# Patient Record
Sex: Male | Born: 1970 | Race: Black or African American | Hispanic: No | Marital: Single | Smoking: Never smoker
Health system: Southern US, Community
[De-identification: ages and names within clinical notes are randomized; demographics above are authoritative.]

## PROBLEM LIST (undated history)

## (undated) DIAGNOSIS — M25561 Pain in right knee: Secondary | ICD-10-CM

## (undated) DIAGNOSIS — M25562 Pain in left knee: Secondary | ICD-10-CM

## (undated) DIAGNOSIS — M67919 Unspecified disorder of synovium and tendon, unspecified shoulder: Secondary | ICD-10-CM

## (undated) DIAGNOSIS — I10 Essential (primary) hypertension: Secondary | ICD-10-CM

## (undated) DIAGNOSIS — K5792 Diverticulitis of intestine, part unspecified, without perforation or abscess without bleeding: Secondary | ICD-10-CM

## (undated) DIAGNOSIS — E782 Mixed hyperlipidemia: Secondary | ICD-10-CM

## (undated) DIAGNOSIS — G8929 Other chronic pain: Secondary | ICD-10-CM

## (undated) DIAGNOSIS — M719 Bursopathy, unspecified: Secondary | ICD-10-CM

## (undated) DIAGNOSIS — R740 Nonspecific elevation of levels of transaminase and lactic acid dehydrogenase [LDH]: Secondary | ICD-10-CM

## (undated) DIAGNOSIS — E669 Obesity, unspecified: Secondary | ICD-10-CM

## (undated) HISTORY — DX: Pain in left knee: M25.562

## (undated) HISTORY — DX: Pain in right knee: M25.561

## (undated) HISTORY — DX: Essential (primary) hypertension: I10

## (undated) HISTORY — DX: Diverticulitis of intestine, part unspecified, without perforation or abscess without bleeding: K57.92

## (undated) HISTORY — DX: Unspecified disorder of synovium and tendon, unspecified shoulder: M67.919

## (undated) HISTORY — DX: Bursopathy, unspecified: M71.9

## (undated) HISTORY — DX: Mixed hyperlipidemia: E78.2

## (undated) HISTORY — DX: Other chronic pain: G89.29

## (undated) HISTORY — DX: Obesity, unspecified: E66.9

## (undated) HISTORY — DX: Nonspecific elevation of levels of transaminase and lactic acid dehydrogenase (ldh): R74.0

---

## 2001-06-22 ENCOUNTER — Emergency Department (HOSPITAL_COMMUNITY): Admission: EM | Admit: 2001-06-22 | Discharge: 2001-06-22 | Payer: Self-pay

## 2007-05-13 ENCOUNTER — Ambulatory Visit: Payer: Self-pay | Admitting: Family Medicine

## 2007-05-13 DIAGNOSIS — E785 Hyperlipidemia, unspecified: Secondary | ICD-10-CM | POA: Insufficient documentation

## 2007-05-13 DIAGNOSIS — E669 Obesity, unspecified: Secondary | ICD-10-CM

## 2007-05-13 DIAGNOSIS — E782 Mixed hyperlipidemia: Secondary | ICD-10-CM

## 2007-05-13 HISTORY — DX: Obesity, unspecified: E66.9

## 2007-05-13 HISTORY — DX: Mixed hyperlipidemia: E78.2

## 2007-05-13 LAB — CONVERTED CEMR LAB
BUN: 14 mg/dL (ref 6–23)
CO2: 26 meq/L (ref 19–32)
Calcium: 9.6 mg/dL (ref 8.4–10.5)
Chloride: 103 meq/L (ref 96–112)
Creatinine, Ser: 0.96 mg/dL (ref 0.40–1.50)
LDL Cholesterol: 197 mg/dL — ABNORMAL HIGH (ref 0–99)

## 2007-05-14 ENCOUNTER — Encounter: Payer: Self-pay | Admitting: Family Medicine

## 2008-05-26 ENCOUNTER — Telehealth: Payer: Self-pay | Admitting: Family Medicine

## 2008-06-22 ENCOUNTER — Ambulatory Visit: Payer: Self-pay | Admitting: Family Medicine

## 2008-06-22 ENCOUNTER — Ambulatory Visit (HOSPITAL_COMMUNITY): Admission: RE | Admit: 2008-06-22 | Discharge: 2008-06-22 | Payer: Self-pay | Admitting: Family Medicine

## 2008-06-22 DIAGNOSIS — I10 Essential (primary) hypertension: Secondary | ICD-10-CM | POA: Insufficient documentation

## 2008-06-22 LAB — CONVERTED CEMR LAB
CO2: 25 meq/L (ref 19–32)
Chloride: 106 meq/L (ref 96–112)
Glucose, Bld: 83 mg/dL (ref 70–99)
Potassium: 4.1 meq/L (ref 3.5–5.3)
Sodium: 141 meq/L (ref 135–145)

## 2008-06-23 ENCOUNTER — Encounter: Payer: Self-pay | Admitting: Family Medicine

## 2008-06-26 ENCOUNTER — Encounter: Payer: Self-pay | Admitting: Family Medicine

## 2008-07-11 ENCOUNTER — Ambulatory Visit: Payer: Self-pay | Admitting: Family Medicine

## 2009-03-07 ENCOUNTER — Ambulatory Visit: Payer: Self-pay | Admitting: Family Medicine

## 2009-04-30 ENCOUNTER — Ambulatory Visit: Payer: Self-pay | Admitting: Family Medicine

## 2009-04-30 LAB — CONVERTED CEMR LAB
HDL: 52 mg/dL (ref >39)
LDL Cholesterol: 161 mg/dL — ABNORMAL HIGH (ref 0–99)
Total CHOL/HDL Ratio: 4.3
Triglycerides: 53 mg/dL (ref <150)
VLDL: 11 mg/dL (ref 0–40)

## 2009-05-01 ENCOUNTER — Encounter: Payer: Self-pay | Admitting: Family Medicine

## 2009-05-24 ENCOUNTER — Ambulatory Visit: Payer: Self-pay | Admitting: Family Medicine

## 2009-05-24 DIAGNOSIS — M67919 Unspecified disorder of synovium and tendon, unspecified shoulder: Secondary | ICD-10-CM | POA: Insufficient documentation

## 2009-05-24 DIAGNOSIS — M719 Bursopathy, unspecified: Secondary | ICD-10-CM

## 2009-05-24 HISTORY — DX: Unspecified disorder of synovium and tendon, unspecified shoulder: M67.919

## 2009-05-30 ENCOUNTER — Encounter: Admission: RE | Admit: 2009-05-30 | Discharge: 2009-08-02 | Payer: Self-pay | Admitting: Family Medicine

## 2009-07-03 ENCOUNTER — Encounter: Payer: Self-pay | Admitting: Family Medicine

## 2009-08-07 ENCOUNTER — Telehealth: Payer: Self-pay | Admitting: Family Medicine

## 2009-08-09 ENCOUNTER — Encounter: Payer: Self-pay | Admitting: Family Medicine

## 2010-03-25 ENCOUNTER — Telehealth: Payer: Self-pay | Admitting: Family Medicine

## 2010-03-25 ENCOUNTER — Ambulatory Visit: Payer: Self-pay | Admitting: Family Medicine

## 2010-04-01 ENCOUNTER — Ambulatory Visit: Payer: Self-pay

## 2010-04-22 ENCOUNTER — Encounter: Payer: Self-pay | Admitting: Family Medicine

## 2010-04-22 LAB — CONVERTED CEMR LAB
CO2: 25 meq/L (ref 19–32)
Calcium: 9.8 mg/dL (ref 8.4–10.5)
Cholesterol: 233 mg/dL — ABNORMAL HIGH (ref 0–200)
Creatinine, Ser: 1 mg/dL (ref 0.40–1.50)
HDL: 53 mg/dL (ref >39)
PSA: 1.06 ng/mL (ref <=4.00)
Triglycerides: 67 mg/dL (ref <150)

## 2010-04-23 ENCOUNTER — Encounter: Payer: Self-pay | Admitting: Family Medicine

## 2010-05-14 NOTE — Miscellaneous (Signed)
Summary: Consent Steroid Inj  Consent Steroid Inj   Imported By: Alexander Gentry 04/30/2009 16:23:06  _____________________________________________________________________  External Attachment:    Type:   Image     Comment:   External Document

## 2010-05-14 NOTE — Letter (Signed)
Summary: [facility] Lipid Letter  [name]  [address]   [place], [place] [address]   Phone: [phone]  Fax: [phone]    05/01/2009 MRN: [id number]  Alexander Gentry [address] [place], [place]  [address]  Dear Alexander Gentry:  We have carefully reviewed your last lipid profile from 04/30/2009 and the results are noted below with a summary of recommendations for lipid management.    Cholesterol:       224     Goal: <200   HDL "good" Cholesterol:   52     Goal: >40   LDL "bad" Cholesterol:   161     Goal: <160   Triglycerides:       53     Goal: <150   Your lipid goals have not been met; we recommend improving on your TLC diet and Adjunctive Measures (see below) and make the following changes to your regimen:    TLC Diet (Therapeutic Lifestyle Change): Saturated Fats & Transfatty acids should be kept < 7% of total calories ***Reduce Saturated Fats Polyunstaurated Fat can be up to 10% of total calories Monounsaturated Fat Fat can be up to 20% of total calories Total Fat should be no greater than 25-35% of total calories Carbohydrates should be 50-60% of total calories Protein should be approximately 15% of total calories Fiber should be at least 20-30 grams a day ***Increased fiber may help lower LDL Total Cholesterol should be < 200mg /day Consider adding plant stanol/sterols to diet (example: Benacol spread) ***A higher intake of unsaturated fat may reduce Triglycerides and Increase HDL    Adjunctive Measures (may lower LIPIDS and reduce risk of Heart Attack) include: Aerobic Exercise (20-30 minutes 3-4 times a week) Limit Alcohol Consumption Weight Reduction Aspirin 75-81 mg a day by mouth (if not allergic or contraindicated) Folic Acid 1mg  a day by mouth Dietary Fiber 20-30 grams a day by mouth     Current Medications:  None If you have any questions, please call. We appreciate being able to work with you.   Sincerely,   Tawanna Cooler Forest Pruden MD [name] Tawanna Cooler Inri Sobieski MD     Appended Document: [facility] Lipid Letter mailed.  Appended Document: [facility] Lipid Letter letter returned -

## 2010-05-14 NOTE — Assessment & Plan Note (Signed)
Summary: f/u,df   Vital Signs:  Patient profile:   40 year old male Height:      68 inches Weight:      206.6 pounds BMI:     31.53 Pulse rate:   68 / minute BP supine:   129 / 87  CC:  cholesterol and blood pressure.  History of Present Illness: Disease Surveillance   Blood pressure range: 120-130/ 70 -90   Chest pain:none   Dyspnea:none   Claudication:none  Medications   Compliance:On no medications  Prevention   Exercise:lifting weights    Diet pattern:not watching fats and salts in his diet.   Right shoulder pain Onset: about 1 year ago, progressive worsening over last month. No hx of prior shoulder pain Precipitant: No trauma.  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   Not lifting heavy loads or working with arms ovedrhead. Qaulity: aching, worse upon awakening in morning, worse when abducting ashoulder greater than 90 degrees. Radiation: radiates into anterolateral arm.  No radiation below elbow. No neck pain or stiffness. Exercise: does light weight liefting Txs: Tried Ibuprofen 800mg  without relief           Habits & Providers  Alcohol-Tobacco-Diet     Tobacco Status: never  Current Medications (verified): 1)  None  Allergies (verified): No Known Drug Allergies PMH-FH-SH reviewed for relevance  Family History: Reviewed history from 05/13/2007 and no changes required. 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 Never Smoked Drug use-no Alcohol use-no  Review of Systems MS:  Denies joint redness, joint swelling, and low back pain.  Physical Exam  General:  Well-developed,well-nourished,in no acute distress; alert,appropriate and cooperative throughout examination  overweight-appearing.   Neck:  supple, full ROM, and no masses. No  thyromegaly  Lungs:  normal respiratory effort, no intercostal retractions, no accessory muscle use, and normal breath sounds.   Heart:  normal rate, regular rhythm, no murmur, no gallop, no JVD, and no HJR.   Abdomen:  soft, non-tender, normal bowel sounds, no distention, no masses, no hepatomegaly, and no splenomegaly.     Shoulder/Elbow Exam  Shoulder Exam:    Right:    Inspection:  Normal    Palpation:  Normal    Stability:  stable    Tenderness:  no    Swelling:  no    Erythema:  no    Limited active internal ROM - able to get right thumb to mid to upper lumbar vert area Limited active external ROM _ able to reach left finger tips lowet on thoracic back than with right by  ~ 2-3 inches.  (+) right Impingement sign. Active abduction pain starts at 90 degrees   Impression & Recommendations:  Problem # 1:  BURSITIS, RIGHT SHOULDER (ICD-726.10) Assessment New  After discussing options of treatment  which included NSIADS, exercise, relative rest, corticosteroid injection. Mr Dolberry chose a trial corticosteroid injection.  Pt informed of risks of bleeding and infection with Intraarticular shoulder steroid injection.  Procedure: steril prep with betadine and alcohol swabbing of left lateral shoulder. Intraarticular injection with 1 ml kenalog (40mg /45ml) and 4 ml 2% Xylocaine of left shoulder from posterolateral approach. No complications. Patient had significant improvement in shoulder pain with AROM. Pt given ROM and strengthening exercises to start tomorrow for shoulder bursitis. RTC in two weeks to assess response.  If no improvement, consider Xrays and referral to Sports Medicine Clinic  Orders: [facility]- Est Level  3 ([address])  Problem # 2:  ELEVATED BLOOD PRESSURE WITHOUT DIAGNOSIS OF HYPERTENSION (ICD-796.2) Assessment: Comment Only  No evidence form office BP measures and reported home BP measures, nor historical evidence of endorgan damage to support diagnosis of hypertension  at this time.  Patient in high normal BP range that warrants continued annual BP monitoring in office and continue home monitoring as needed.  Orders: [facility]- Est Level  3 ([address])  Problem # 3:  HYPERLIPIDEMIA (ICD-272.2) Goal LDL < 160 mg/dL. Encourgaed weight loss, exercise, and low fat, low cholesterol diet. Lipid panel pending.  Orders: Lipid-[facility] ([id number]) [facility]- Est Level  3 ([address])  Patient Instructions: 1)  Please schedule a follow-up appointment in 2 to 3 weeks. May double book.  This is to recheck shoulder bursitis. 2)  Tomorrow, start the shoulder exercises.  Do five repetions of each shoulder exercise 3 times a day.  3)  I will let you know the results of your blood work.    Prevention & Chronic Care Immunizations   Influenza vaccine: Fluvax Non-MCR  (03/07/2009)    Tetanus booster: 06/22/2008: Tdap    Pneumococcal vaccine: Not documented  Other Screening   Smoking status: never  (04/30/2009)  Lipids   Total Cholesterol: 264  (05/13/2007)   LDL: 197  (05/13/2007)   LDL Direct: 175  (06/22/2008)   HDL: 48  (05/13/2007)   Triglycerides: 97  (05/13/2007)    SGOT (AST): Not documented   SGPT (ALT): Not documented   Alkaline phosphatase: Not documented   Total bilirubin: Not documented    Lipid flowsheet reviewed?: Yes   Progress toward LDL goal: Unchanged  Self-Management Support :   Personal Goals (by the next clinic visit) :      Personal LDL goal: 160  (04/30/2009)    Lipid self-management support: Written self-care plan, Education handout, Pre-printed educational material  (04/30/2009)   Lipid self-care plan printed.   Lipid education handout printed

## 2010-05-14 NOTE — Miscellaneous (Signed)
Summary: Discharge Summary for Physical Therapy  Alexander Gentry met his physical therapy goals for his right shoulder pain. He is independent in his home exercise program He reports decrease of 50% with all motions. Increase ROM to WNL throughout. Increase strength to 4/5 throughout shoulder and elbow on the right.  Remaining deficits: None except for ratre pain with increased activity. Patient states he is 90% better than prior to Physical Therapy interventions.  Patient agrees to discharge. Patient goals were met.

## 2010-05-14 NOTE — Progress Notes (Signed)
Summary: phn msg  Phone Note Call from Patient Call back at Home Phone [phone]   Caller: Patient Summary of Call: wants to know if he could bring his son(new pt) in for a school phys before he goes to college? DREVON PLOG [birth year]/17[birth year]90  Erlene Senters [phone] - grandmother Initial call taken by: De Nurse,  August 07, 2009 11:21 AM  Follow-up for Phone Call        Please call patient to arrange appointment for his son.  If patient cannot see me in a timely fashion, please schedule patient with a resident. Follow-up by: Tawanna Cooler Lyna Laningham MD,  August 07, 2009 11:25 AM  Additional Follow-up for Phone Call Additional follow up Details #1::        appt made for 5/5 w/ Timmya Blazier Additional Follow-up by: De Nurse,  August 07, 2009 2:34 PM

## 2010-05-14 NOTE — Miscellaneous (Signed)
Summary: Authorization for Iontophoresis Dexamethasone   Clinical Lists Changes Signed and fax authorization for iontophoresis 4mg /mL of dexamethasone for  Alexander Gentry's right shoulder to [facility].

## 2010-05-14 NOTE — Assessment & Plan Note (Signed)
Summary: F/U VISIT Right Shoulder Pain/[facility]   Vital Signs:  Patient profile:   40 year old male Height:      68 inches Weight:      200.5 pounds BMI:     30.60 Temp:     98.2 degrees F oral Pulse rate:   73 / minute BP sitting:   132 / 85  (right arm) Cuff size:   regular  Vitals Entered By: Garen Grams LPN (May 24, 2009 9:07 AM) CC: f/u shoulder pain Is Patient Diabetic? No Pain Assessment Patient in pain? yes     Location: right shoulder   CC:  f/u shoulder pain.  History of Present Illness: Right shoulder pain Improved only for one day after corticosteroid shoulder injection on 04-30-09. 40 YO L. 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   Sharp right shoulder pain in lateral shoulder that goes down about a quarter way arm.  No pain below elbow.  Onset:  ~ 6 months ago   Trauma hx: No    No prior diagnostic workup   No progression in pain Worse with external rotation of arm or when carrying groceries or other heavy objects.. Shoulder feels tired.  Not better with Ibuprofen.  No swelling, No clicking, no locking, No neck pain   Habits & Providers  Alcohol-Tobacco-Diet     Alcohol drinks/day: <1     Tobacco Status: never  Current Problems (verified): 1)  Rotator Cuff Injury, Right Shoulder  (ICD-726.10) 2)  Bursitis, Right Shoulder  (ICD-726.10) 3)  Need Prophylactic Vaccination&inoculation Flu  (ICD-V04.81) 4)  Elevated Blood Pressure Without Diagnosis of Hypertension  (ICD-796.2) 5)  Obesity  (ICD-278.00) 6)  Hyperlipidemia  (ICD-272.2) 7)  Family History of Diabetes Mellitus  (ICD-V18.0)  Current Medications (verified): 1)  Naprosyn 500 Mg Tabs (Naproxen) .... One Tablet Twice A Day With Food If Needed For Shoulder Pain  Allergies (verified): No Known Drug Allergies  Past History:  Past medical, surgical, family and social histories (including risk factors) reviewed for relevance to current  acute and chronic problems.  Past Medical History: Reviewed history from 05/13/2007 and no changes required. None  Past Surgical History: Reviewed history from 05/13/2007 and no changes required. None  Family History: Reviewed history from 05/13/2007 and no changes required. Diabetes in maternal uncle Family History Hypertension in Mother  Social History: Reviewed history from 04/30/2009 and no changes required. 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 Never Smoked Drug use-no Alcohol use-occassional  Physical Exam  General:  Well-developed,well-nourished,in no acute distress; alert,appropriate and cooperative throughout examination Neck:  Full ROM Negative Spurling's Maneuver Nontender Trapezius/supraspinatus/infrospinatus. Msk:  Right Shoulder Symmetric contour shoulders No erythema or edema Active ABduction full (+) Apley Scretch test (+) Reverse Apley scratch test (+) Empty Can test Weaker R c.f. L resisted external rotation  Weaker R c.f. L "Lift-off" test of internal rotation (+) Neer's (+) Hawkins (-) Drop-Arm test (-) Cross-arm test Pulses:  R radial normal.     Impression & Recommendations:  Problem # 1:  ROTATOR CUFF INJURY, RIGHT SHOULDER (ICD-726.10) Failed corticosteroid intraarticular injection of shoulder.  Plan: Naproxen 500 mg tablets, twice a day as needed. Take with food Internal and External arm rotation exercises Physical Therapy referral for evaluation and treatment Pt education material about rotator cuff tendonitis RTC in 6-7 weeks for re-evaluation.  Right Shoulder Xray and  Referral to Sports Medicine Clinic if  not improved at that time Orders: Physical Therapy Referral (PT) [facility]- Est Level  3 ([address]25)  Complete Medication List: 1)  Naprosyn 500 Mg Tabs (Naproxen) .... One tablet twice a day with food if needed for shoulder pain  Patient Instructions: 1)   Please schedule a follow-up appointment in 6-7 weeks to see how your shoulder is doing. May double book. 2)  Do the internal and external rotation exercises for your shoulder. Do ten repetitions three times a day. 3)  [name] will contact you about starting physical therapy for your shoulder.  4)    Prescriptions: NAPROSYN 500 MG TABS (NAPROXEN) One tablet twice a day with food if needed for shoulder pain  #60 x 1   Entered and Authorized by:   Tawanna Cooler Deaunte Dente MD   Signed by:   Tawanna Cooler Jovian Lembcke MD on 05/24/2009   Method used:   Print then Give to Patient   RxID:   [id number]

## 2010-05-16 NOTE — Assessment & Plan Note (Signed)
Summary: meds check,df   Vital Signs:  Patient profile:   40 year old male Height:      68 inches Weight:      209 pounds BMI:     31.89 BSA:     2.08 Temp:     97.8 degrees F Pulse rate:   74 / minute BP sitting:   141 / 94  Vitals Entered By: Jone Baseman CMA (April 22, 2010 2:30 PM)  Serial Vital Signs/Assessments:  Time      Position  BP       Pulse  Resp  Temp     By                     142/92                         Tawanna Cooler Renette Hsu MD  CC: f/u Is Patient Diabetic? No Pain Assessment Patient in pain? no        CC:  f/u.  History of Present Illness: Disease Surveillance in patient with history of elevated blood pressure   Blood pressure range:Not checking at home.   Chest pain:no   Dyspnea:no   Claudication:no  Medications   Compliance:not taking any antihypertensives    Lightheadedness:no   Urinary frequency:no   Edema:no   Impotence:no  Prevention   Exercise:Doing "cardio" most days out of the week at gym   Diet pattern:not watching fats.  Is using salt substitute.        Habits & Providers  Alcohol-Tobacco-Diet     Alcohol drinks/day: <1     Tobacco Status: never  Current Problems (verified): 1)  Elevated Blood Pressure Without Diagnosis of Hypertension  (ICD-796.2) 2)  Obesity  (ICD-278.00) 3)  Hyperlipidemia  (ICD-272.2) 4)  Family History of Diabetes Mellitus  (ICD-V18.0) 5)  Rotator Cuff Injury, Right Shoulder  (ICD-726.10)  Current Medications (verified): 1)  Naprosyn 500 Mg Tabs (Naproxen) .... One Tablet Twice A Day With Food If Needed For Shoulder Pain  Allergies (verified): No Known Drug Allergies  Family History: Diabetes in maternal uncle Family History Hypertension in Mother No history of cancer.   Physical Exam  General:  alert and well-developed. alert, well-developed, healthy-appearing, cooperative to examination, good hygiene, and overweight-appearing.   Eyes:  pupils equal, pupils round, pupils reactive to  light, corneas and lenses clear, no injection, no optic disk abnormalities, no retinal abnormalitiies, and normal cup-disc ratio.   Neck:  no thyromegaly and no carotid bruits.   Chest Wall:  no deformities.   Breasts:  no gynecomastia.   Lungs:  normal respiratory effort, normal breath sounds, no crackles, and no wheezes.   Heart:  normal rate, regular rhythm, no murmur, no gallop, and no JVD.   Abdomen:  soft, non-tender, normal bowel sounds, no distention, no masses, no abdominal hernia, no hepatomegaly, and no splenomegaly.   Pulses:  R dorsalis pedis normal and L dorsalis pedis normal.   Extremities:  No peripheral edema. No clubbing.  Psych:  normally interactive, good eye contact, not anxious appearing, and not depressed appearing.     Impression & Recommendations:  Problem # 1:  ELEVATED BLOOD PRESSURE WITHOUT DIAGNOSIS OF HYPERTENSION (ICD-796.2) Assessment Deteriorated Raygen's elevated blood pressure in the past has responded to lifestyle changes and weight loss.  He is agreeable to increasing his exercise and improving his diet for next 3 months to see if he can get his BP under control.  RTC in 3 months for assessment.   Problem # 2:  OBESITY (ICD-278.00) Assessment: Deteriorated Weight is up from last visit.  See px #1 for recommendations.  Orders: Basic Met-[facility] ([id number]) [facility]- Est Level  3 ([address])  Problem # 3:  HYPERLIPIDEMIA (ICD-272.2) Essentially unchanged from previous at 167 mg/dL.  enourgae TLC and adjuvant meausres.  Pt education material on role of diet and exercise in controlling LDL cholesterol.  Will monitor annually. Would not recommend pursuing statin therapy at this time.  Orders: Lipid-[facility] ([id number]) [facility]- Est Level  3 (87564)  Problem # 4:  Preventive Health Care (ICD-V70.0) screening PSA within acceptable range.  Recheck in 2 years for screening for prostate cancer in higher risk population (AA male)  Complete Medication List: 1)  Naprosyn 500  Mg Tabs (Naproxen) .... One tablet twice a day with food if needed for shoulder pain  Other Orders: PSA-[facility] ([id number])  Patient Instructions: 1)  Please schedule a follow-up appointment in 3 months to follow up high blood pressure.Marland Kitchen  2)  Keep up your cardio workouts, they will help you lose weight. 3)  You should aim for exercising most days a week for at least 30 minutes.  4)  Emphasize fresh vegetables and fruits in your meals and snacks.    Orders Added: 1)  Basic Met-[facility] [[id number]] 2)  Lipid-[facility] [[id number]] 3)  PSA-[facility] [[id number]] 4)  [facility]- Est Level  3 [20254]        Prevention & Chronic Care Immunizations   Influenza vaccine: Fluvax Non-MCR  (03/07/2009)   Influenza vaccine due: 03/07/2010    Tetanus booster: 06/22/2008: Tdap   Tetanus booster due: 06/23/2018    Pneumococcal vaccine: Not documented  Other Screening   Smoking status: never  (04/22/2010)  Lipids   Total Cholesterol: 224  (04/30/2009)   LDL: 161  (04/30/2009)   LDL Direct: 175  (06/22/2008)   HDL: 52  (04/30/2009)   Triglycerides: 53  (04/30/2009)    SGOT (AST): Not documented   SGPT (ALT): Not documented   Alkaline phosphatase: Not documented   Total bilirubin: Not documented    Lipid flowsheet reviewed?: Yes   Progress toward LDL goal: Unchanged  Self-Management Support :   Personal Goals (by the next clinic visit) :      Personal LDL goal: 160  (04/30/2009)    Lipid self-management support: Written self-care plan, Education handout, Pre-printed educational material  (04/22/2010)   Lipid self-care plan printed.   Lipid education handout printed  Appended Document: Orders Update    Clinical Lists Changes  Observations: Added new observation of FLU VAX: Fluvax 3+ (04/22/2010 8:43)       Immunization History:  Influenza Immunization History:    Influenza:  fluvax 3+ (04/22/2010)

## 2010-05-16 NOTE — Letter (Signed)
Summary: [facility] Lipid Letter  [facility]  [address]   [place], [place] [address]   Phone: [phone]  Fax: [phone]    04/23/2010 MRN: [id number]  Alexander Gentry [address] [place], [place]  [address]  Dear Alexander Gentry:  Your kidney, blood sugar, and electrolyte blood tests were all normal. Your blood sugar was normal. The test for prostate cancer was normal.  Would repeat this test in two years.   Here is your last cholesterol profile from 04/22/2010 and the results are noted below with a summary of recommendations for lipid management.    Cholesterol:       233     Goal: <200   HDL "good" Cholesterol:   53     Goal: >40   LDL "bad" Cholesterol:   167               Goal: <160   Triglycerides:       67     Goal: <150   Your LDL "bad" cholesterol goal has not been met; I recommend improving on your TLC diet and Adjunctive Measures (see below) and make the following changes to your regimen:    TLC Diet (Therapeutic Lifestyle Change): Saturated Fats & Transfatty acids should be kept < 7% of total calories ***Reduce Saturated Fats Polyunstaurated Fat can be up to 10% of total calories Monounsaturated Fat Fat can be up to 20% of total calories Total Fat should be no greater than 25-35% of total calories Carbohydrates should be 50-60% of total calories Protein should be approximately 15% of total calories Fiber should be at least 20-30 grams a day ***Increased fiber may help lower LDL Total Cholesterol should be < 200mg /day Consider adding plant stanol/sterols to diet (example: Benacol spread) ***A higher intake of unsaturated fat may reduce Triglycerides and Increase HDL    Adjunctive Measures (may lower LIPIDS and reduce risk of Heart Attack) include: Aerobic Exercise (20-30 minutes 3-4 times a week) Limit Alcohol Consumption Weight Reduction Aspirin 75-81 mg a day by mouth (if not allergic or contraindicated) Folic Acid 1mg  a day by  mouth Dietary Fiber 20-30 grams a day by mouth  You can read more about how to lower your cholesterol with changes in diet at [employer].  Search this government website for "hypercholesterolemia".      Current Medications: 1)    Naprosyn 500 Mg Tabs (Naproxen) .... One tablet twice a day with food if needed for shoulder pain  If you have any questions, please call. We appreciate being able to work with you.   Sincerely,   Tawanna Cooler Bow Buntyn MD [name] Tawanna Cooler Kelsen Celona MD     Appended Document: [facility] Lipid Letter sent

## 2010-05-16 NOTE — Progress Notes (Signed)
Summary: resch  Phone Note Call from Patient   Summary of Call: pt has the flu and didn't want to spead it around - resch for 1st of year Initial call taken by: De Nurse,  March 25, 2010 11:46 AM  Follow-up for Phone Call        Lynsi Dooner Notified Follow-up by: Tawanna Cooler McDiarmid MD,  March 25, 2010 11:57 AM

## 2010-05-20 ENCOUNTER — Encounter: Payer: Self-pay | Admitting: *Deleted

## 2010-11-13 DIAGNOSIS — I10 Essential (primary) hypertension: Secondary | ICD-10-CM

## 2010-11-13 HISTORY — DX: Essential (primary) hypertension: I10

## 2010-12-09 ENCOUNTER — Encounter: Payer: Self-pay | Admitting: Family Medicine

## 2010-12-09 ENCOUNTER — Ambulatory Visit (INDEPENDENT_AMBULATORY_CARE_PROVIDER_SITE_OTHER): Payer: 59 | Admitting: Family Medicine

## 2010-12-09 DIAGNOSIS — E7800 Pure hypercholesterolemia, unspecified: Secondary | ICD-10-CM

## 2010-12-09 DIAGNOSIS — E782 Mixed hyperlipidemia: Secondary | ICD-10-CM

## 2010-12-09 DIAGNOSIS — I10 Essential (primary) hypertension: Secondary | ICD-10-CM

## 2010-12-09 LAB — COMPREHENSIVE METABOLIC PANEL
ALT: 57 U/L — ABNORMAL HIGH (ref 0–53)
AST: 29 U/L (ref 0–37)
Creat: 0.93 mg/dL (ref 0.50–1.35)
Total Bilirubin: 0.5 mg/dL (ref 0.3–1.2)

## 2010-12-09 MED ORDER — HYDROCHLOROTHIAZIDE 25 MG PO TABS: 12.5000 mg | ORAL_TABLET | Freq: Every day | ORAL | Status: DC

## 2010-12-09 MED ORDER — NAPROXEN 500 MG PO TABS: 500.0000 mg | ORAL_TABLET | Freq: Two times a day (BID) | ORAL | Status: DC

## 2010-12-09 NOTE — Patient Instructions (Addendum)
Start Hydrochlorothiazide blood pressure medication for high blood pressure.  Take half a tablet once a day in the morning.  Avoid salty foods.  If it tastes salty it is salty.  Avoid canned and frozen foods unless they say low sodium or low salt.  Goal weight of 200 pounds would be very healthy for you and may help get you off of Blood pressure medicines. Dr McDiarmid will contact you about your cholesterol if you should consider starting a cholesterol lowering medication like Lipitor or Crestor.  3 to 4 Gram Sodium Diet, No Added Salt (NAS) A 3 to 4 gram sodium diet restricts the amount of sodium in the diet to no more than 3 to 4 grams (g) or 3000 to 4000 milligrams (mg) daily. Limiting the amount of sodium is often used to help lower blood pressure. It is important if you have heart, liver, or kidney problems. Many foods contain sodium for flavor and sometimes as a preservative. When the amount of sodium in a diet needs to be low, it is important to know what to look for when choosing foods and drinks. The following includes some information and guidelines to help make it easier for you to adapt to a low sodium diet. QUICK TIPS  Do not add salt to food.   Avoid convenience items and fast food.   Choose unsalted snack foods.   Buy lower sodium products, often labeled as "lower sodium" or "no salt added."   Check food labels to learn how much sodium is in 1 serving.   When eating at a restaurant, ask that your food be prepared with less salt or none, if possible.  READING FOOD LABELS FOR SODIUM INFORMATION The nutrition facts label is a good place to find how much sodium is in foods. Look for products with no more than 500 to 600 mg of sodium per meal and no more than 150 mg per serving. Remember that 3 to 4 g = 3000 to 4000 mg. The food label may also list foods as:  Sodium-free: Less than 5 mg in a serving.   Very low sodium: 35 mg or less in a serving.   Low-sodium: 140 mg or less  in a serving.   Light in sodium: 50% less sodium in a serving. For example, if a food that usually has 300 mg of sodium is changed to become light in sodium, it will have 150 mg of sodium.   Reduced sodium: 25% less sodium in a serving. For example, if a food that usually has 400 mg of sodium is changed to reduced sodium, it will have 300 mg of sodium.  CHOOSING FOODS AVOID  CHOOSE   Grains:  Salted crackers and snack items. Bread stuffing and biscuit mixes. Seasoned rice or pasta mixes.  Grains:  Unsalted snack items. English muffins, breads, and rolls. Homemade pancakes and waffles. Most cereals. Pasta.   Meats:  Salted, canned, smoked, spiced, pickled meats, including fish and poultry. Bacon, ham, sausage, cold cuts, hot dogs, anchovies.  Meats:  Low-sodium canned tuna and salmon. Fresh or frozen meat, poultry, and fish.   Dairy:  Processed cheese and spreads. Cottage cheese. Buttermilk and condensed milk. Regular cheese.  Dairy:  Milk. Low-sodium cottage cheese. Yogurt. Sour cream. Low-sodium cheese.   Fruits and Vegetables:  Regular canned vegetables. Regular canned tomato sauce and paste. Frozen vegetables in sauces. Olives. Rosita Fire. Relishes. Sauerkraut.  Fruits and Vegetables:  Low-sodium canned vegetables. Low-sodium tomato sauce and paste. Frozen or fresh  vegetables. Fresh and frozen fruit.   Condiments:  Canned and packaged gravies. Worcestershire sauce. Tartar sauce. Barbecue sauce. Soy sauce. Steak sauce. Ketchup. Onion, garlic, and table salt. Meat flavorings and tenderizers.  Condiments:  Fresh and dried herbs and spices. Low-sodium varieties of mustard and ketchup. Lemon juice. Tabasco sauce. Horseradish.   SAMPLE 3 TO 4 GRAM SODIUM MEAL PLAN   Meal  Foods  Sodium (mg)   Breakfast  1 cup low-fat milk  2 slices whole-wheat toast 1 tablespoon heart-healthy margarine 1 hard-boiled egg  [phone]   Lunch  1 cup raw carrots   cup  hummus 1 cup low-fat milk  cup red grapes 1 cup low-sodium chicken and rice soup 10 low-sodium saltine crackers  [phone]   Dinner  1 cup whole-wheat pasta  1 cup tomato sauce 3 ounces (oz) lean ground beef 1 small side salad (1 cup raw spinach leaves,  cup cucumber,  cup yellow bell pepper)   1 teaspoon ranch dressing  2  [phone]   Snack  1 slice cheddar cheese  1 medium apple  258  1   Nutrient Analysis Calories: 2005 Protein: 85 g Carbohydrate: 245 g Fat: 78 g Sodium: 3560 mg Information from [email or web address] Document Released: 03/31/2005 Document Re-Released: 09/18/2009 [facility] Patient Information 2011 [place], [place].Low-Fat, Low-Saturated-Fat, Low-Cholesterol Diets Food Selection Guide BREADS, CEREALS, PASTA, RICE, DRIED PEAS, AND BEANS These products are high in carbohydrates and most are low in fat. Therefore, they can be increased in the diet as substitutes for fatty foods. They too, however, contain calories and should not be eaten in excess. Cereals can be eaten for snacks as well as for breakfast.   Include foods that contain fiber (fruits, vegetables, whole grains, and legumes). Research shows that fiber may lower blood cholesterol levels, especially the water-soluble fiber found in fruits, vegetables, oat products, and legumes. FRUITS AND VEGETABLES It is good to eat fruits and vegetables. Besides being sources of fiber, both are rich in vitamins and some minerals. They help you get the daily allowances of these nutrients. Fruits and vegetables can be used for snacks and desserts. MEATS Limit lean meat, chicken, [place], and fish to no more than 6 ounces per day. Beef, Pork, and Lamb  Use lean cuts of beef, pork, and lamb. Lean cuts include:   Extra-lean ground beef.   Arm roast.   Sirloin tip.   Center-cut ham.   Round steak.   Loin chops.   Rump roast.   Tenderloin.  Trim all fat off the outside of meats before cooking.  It is not necessary to severely decrease the intake of red meat, but lean choices should be made. Lean meat is rich in protein and contains a highly absorbable form of iron. Premenopausal women, in particular, should avoid reducing lean red meat because this could increase the risk for low red blood cells (iron-deficiency anemia). Processed Meats Processed meats, such as bacon, bologna, salami, sausage, and hot dogs contain large quantities of fat, are not rich in valuable nutrients, and should not be eaten very often. Organ Meats The organ meats, such as liver, sweetbreads, kidneys, and brain are very rich in cholesterol. They should be limited. Chicken and [place] These are good sources of protein. The fat of poultry can be reduced by removing the skin and underlying fat layers before cooking. Chicken and [place] can be substituted for lean red meat in the diet. Poultry should not  be fried or covered with high-fat sauces. Fish and Shellfish Fish is a good source of protein. Shellfish contain cholesterol, but they usually are low in saturated fatty acids. The preparation of fish is important. Like chicken and [place], they should not be fried or covered with high-fat sauces. EGGS Egg yolks often are hidden in cooked and processed foods. Egg whites contain no fat or cholesterol. They can be eaten often. Try 1 to 2 egg whites instead of whole eggs in recipes or use egg substitutes that do not contain yolk. MILK AND DAIRY PRODUCTS Use skim or 1% milk instead of 2% or whole milk. Decrease whole milk, natural, and processed cheeses. Use nonfat or low-fat (2%) cottage cheese or low-fat cheeses made from vegetable oils. Choose nonfat or low-fat (1 to 2%) yogurt. Experiment with evaporated skim milk in recipes that call for heavy cream. Substitute low-fat yogurt or low-fat cottage cheese for sour cream in dips and salad dressings. Have at least 2 servings of low-fat dairy products, such as 2 glasses of skim (or  1%) milk each day to help get your daily calcium intake. FATS AND OILS Reduce the total intake of fats, especially saturated fat. Butterfat, lard, and beef fats are high in saturated fat and cholesterol. These should be avoided as much as possible. Vegetable fats do not contain cholesterol, but certain vegetable fats, such as coconut oil, palm oil, and palm kernel oil are very high in saturated fats. These should be limited. These fats are often used in bakery goods, processed foods, popcorn, oils, and nondairy creamers. Vegetable shortenings and some peanut butters contain hydrogenated oils, which are also saturated fats. Read the labels on these foods and check for saturated vegetable oils. Unsaturated vegetable oils and fats do not raise blood cholesterol. However, they should be limited because they are fats and are high in calories. Total fat should still be limited to 30% of your daily caloric intake. Desirable liquid vegetable oils are corn oil, cottonseed oil, olive oil, canola oil, safflower oil, soybean oil, and sunflower oil. Peanut oil is not as good, but small amounts are acceptable. Buy a heart-healthy tub margarine that has no partially hydrogenated oils in the ingredients. Mayonnaise and salad dressings often are made from unsaturated fats, but they should also be limited because of their high calorie and fat content. Seeds, nuts, peanut butter, olives, and avocados are high in fat, but the fat is mainly the unsaturated type. These foods should be limited mainly to avoid excess calories and fat. OTHER EATING TIPS Snacks  Most sweets should be limited as snacks. They tend to be rich in calories and fats, and their caloric content outweighs their nutritional value. Some good choices in snacks are graham crackers, melba toast, soda crackers, bagels (no egg), English muffins, fruits, and vegetables. These snacks are preferable to snack crackers, [place] fries, and chips. Popcorn should be  air-popped or cooked in small amounts of liquid vegetable oil. Desserts Eat fruit, low-fat yogurt, and fruit ices instead of pastries, cake, and cookies. Sherbet, angel food cake, gelatin dessert, frozen low-fat yogurt, or other frozen products that do not contain saturated fat (pure fruit juice bars, frozen ice pops) are also acceptable.   COOKING METHODS Choose those methods that use little or no fat. They include:  Poaching.   Braising.   Steaming.   Grilling.   Baking.   Stir-frying.   Broiling.   Microwaving.  Foods can be cooked in a nonstick pan without added fat, or  use a nonfat cooking spray in regular cookware. Limit fried foods and avoid frying in saturated fat. Add moisture to lean meats by using water, broth, cooking wines, and other nonfat or low-fat sauces along with the cooking methods mentioned above. Soups and stews should be chilled after cooking. The fat that forms on top after a few hours in the refrigerator should be skimmed off. When preparing meals, avoid using excess salt. Salt can contribute to raising blood pressure in some people. EATING AWAY FROM HOME Order entres, potatoes, and vegetables without sauces or butter. When meat exceeds the size of a deck of cards (3 to 4 ounces), the rest can be taken home for another meal. Choose vegetable or fruit salads and ask for low-calorie salad dressings to be served on the side. Use dressings sparingly. Limit high-fat toppings, such as bacon, crumbled eggs, cheese, sunflower seeds, and olives. Ask for heart-healthy tub margarine instead of butter. Document Released: 09/20/2001 Document Re-Released: 06/25/2009 [facility] Patient Information 2011 [place], [place].Hypertension (High Blood Pressure) As your heart beats, it forces blood through your arteries. This force is your blood pressure. If the pressure is too high, it is called hypertension (HTN) or high blood pressure. HTN is dangerous because you may have it and not  know it. High blood pressure may mean that your heart has to work harder to pump blood. Your arteries may be narrow or stiff. The extra work puts you at risk for heart disease, stroke, and other problems.   Blood pressure consists of two numbers, a higher number over a lower, 110/72, for example. It is stated as "110 over 72." The ideal is below 120 for the top number (systolic) and under 80 for the bottom (diastolic). Write down your blood pressure today. You should pay close attention to your blood pressure if you have certain conditions such as:  Heart failure.   Prior heart attack.     Diabetes    Chronic kidney disease.     Prior stroke.     Multiple risk factors for heart disease.     To see if you have HTN, your blood pressure should be measured while you are seated with your arm held at the level of the heart. It should be measured at least twice. A one-time elevated blood pressure reading (especially in the Emergency Department) does not mean that you need treatment. There may be conditions in which the blood pressure is different between your right and left arms. It is important to see your caregiver soon for a recheck. Most people have essential hypertension which means that there is not a specific cause. This type of high blood pressure may be lowered by changing lifestyle factors such as:  Stress.   Smoking.    Lack of exercise.     Excessive weight.   Drug/tobacco/alcohol use.     Eating less salt.     Most people do not have symptoms from high blood pressure until it has caused damage to the body. Effective treatment can often prevent, delay or reduce that damage. TREATMENT Treatment for high blood pressure, when a cause has been identified, is directed at the cause. There are a large number of medications to treat HTN. These fall into several categories, and your caregiver will help you select the medicines that are best for you. Medications may have side effects. You  should review side effects with your caregiver. If your blood pressure stays high after you have made lifestyle changes or started on  medicines,    Your medication(s) may need to be changed.   Other problems may need to be addressed.   Be certain you understand your prescriptions, and know how and when to take your medicine.   Be sure to follow up with your caregiver within the time frame advised (usually within two weeks) to have your blood pressure rechecked and to review your medications.   If you are taking more than one medicine to lower your blood pressure, make sure you know how and at what times they should be taken. Taking two medicines at the same time can result in blood pressure that is too low.  SEEK IMMEDIATE MEDICAL CARE IF YOU DEVELOP:  A severe headache, blurred or changing vision, or confusion.   Unusual weakness or numbness, or a faint feeling.   Severe chest or abdominal pain, vomiting, or breathing problems.  MAKE SURE YOU:    Understand these instructions.   Will watch your condition.   Will get help right away if you are not doing well or get worse.  Document Released: 03/31/2005 Document Re-Released: 09/18/2009 [facility] Patient Information 2011 [place], [place].Low-Fat, Low-Saturated-Fat, Low-Cholesterol Diets Food Selection Guide BREADS, CEREALS, PASTA, RICE, DRIED PEAS, AND BEANS These products are high in carbohydrates and most are low in fat. Therefore, they can be increased in the diet as substitutes for fatty foods. They too, however, contain calories and should not be eaten in excess. Cereals can be eaten for snacks as well as for breakfast.   Include foods that contain fiber (fruits, vegetables, whole grains, and legumes). Research shows that fiber may lower blood cholesterol levels, especially the water-soluble fiber found in fruits, vegetables, oat products, and legumes. FRUITS AND VEGETABLES It is good to eat fruits and vegetables. Besides being  sources of fiber, both are rich in vitamins and some minerals. They help you get the daily allowances of these nutrients. Fruits and vegetables can be used for snacks and desserts. MEATS Limit lean meat, chicken, [place], and fish to no more than 6 ounces per day. Beef, Pork, and Lamb  Use lean cuts of beef, pork, and lamb. Lean cuts include:   Extra-lean ground beef.   Arm roast.   Sirloin tip.   Center-cut ham.   Round steak.   Loin chops.   Rump roast.   Tenderloin.  Trim all fat off the outside of meats before cooking. It is not necessary to severely decrease the intake of red meat, but lean choices should be made. Lean meat is rich in protein and contains a highly absorbable form of iron. Premenopausal women, in particular, should avoid reducing lean red meat because this could increase the risk for low red blood cells (iron-deficiency anemia). Processed Meats Processed meats, such as bacon, bologna, salami, sausage, and hot dogs contain large quantities of fat, are not rich in valuable nutrients, and should not be eaten very often. Organ Meats The organ meats, such as liver, sweetbreads, kidneys, and brain are very rich in cholesterol. They should be limited. Chicken and [place] These are good sources of protein. The fat of poultry can be reduced by removing the skin and underlying fat layers before cooking. Chicken and [place] can be substituted for lean red meat in the diet. Poultry should not be fried or covered with high-fat sauces. Fish and Shellfish Fish is a good source of protein. Shellfish contain cholesterol, but they usually are low in saturated fatty acids. The preparation of fish is important. Like chicken and [place], they  should not be fried or covered with high-fat sauces. EGGS Egg yolks often are hidden in cooked and processed foods. Egg whites contain no fat or cholesterol. They can be eaten often. Try 1 to 2 egg whites instead of whole eggs in recipes or use egg  substitutes that do not contain yolk. MILK AND DAIRY PRODUCTS Use skim or 1% milk instead of 2% or whole milk. Decrease whole milk, natural, and processed cheeses. Use nonfat or low-fat (2%) cottage cheese or low-fat cheeses made from vegetable oils. Choose nonfat or low-fat (1 to 2%) yogurt. Experiment with evaporated skim milk in recipes that call for heavy cream. Substitute low-fat yogurt or low-fat cottage cheese for sour cream in dips and salad dressings. Have at least 2 servings of low-fat dairy products, such as 2 glasses of skim (or 1%) milk each day to help get your daily calcium intake. FATS AND OILS Reduce the total intake of fats, especially saturated fat. Butterfat, lard, and beef fats are high in saturated fat and cholesterol. These should be avoided as much as possible. Vegetable fats do not contain cholesterol, but certain vegetable fats, such as coconut oil, palm oil, and palm kernel oil are very high in saturated fats. These should be limited. These fats are often used in bakery goods, processed foods, popcorn, oils, and nondairy creamers. Vegetable shortenings and some peanut butters contain hydrogenated oils, which are also saturated fats. Read the labels on these foods and check for saturated vegetable oils. Unsaturated vegetable oils and fats do not raise blood cholesterol. However, they should be limited because they are fats and are high in calories. Total fat should still be limited to 30% of your daily caloric intake. Desirable liquid vegetable oils are corn oil, cottonseed oil, olive oil, canola oil, safflower oil, soybean oil, and sunflower oil. Peanut oil is not as good, but small amounts are acceptable. Buy a heart-healthy tub margarine that has no partially hydrogenated oils in the ingredients. Mayonnaise and salad dressings often are made from unsaturated fats, but they should also be limited because of their high calorie and fat content. Seeds, nuts, peanut butter, olives, and  avocados are high in fat, but the fat is mainly the unsaturated type. These foods should be limited mainly to avoid excess calories and fat. OTHER EATING TIPS Snacks  Most sweets should be limited as snacks. They tend to be rich in calories and fats, and their caloric content outweighs their nutritional value. Some good choices in snacks are graham crackers, melba toast, soda crackers, bagels (no egg), English muffins, fruits, and vegetables. These snacks are preferable to snack crackers, [place] fries, and chips. Popcorn should be air-popped or cooked in small amounts of liquid vegetable oil. Desserts Eat fruit, low-fat yogurt, and fruit ices instead of pastries, cake, and cookies. Sherbet, angel food cake, gelatin dessert, frozen low-fat yogurt, or other frozen products that do not contain saturated fat (pure fruit juice bars, frozen ice pops) are also acceptable.   COOKING METHODS Choose those methods that use little or no fat. They include:  Poaching.   Braising.   Steaming.   Grilling.   Baking.   Stir-frying.   Broiling.   Microwaving.  Foods can be cooked in a nonstick pan without added fat, or use a nonfat cooking spray in regular cookware. Limit fried foods and avoid frying in saturated fat. Add moisture to lean meats by using water, broth, cooking wines, and other nonfat or low-fat sauces along with the cooking methods mentioned  above. Soups and stews should be chilled after cooking. The fat that forms on top after a few hours in the refrigerator should be skimmed off. When preparing meals, avoid using excess salt. Salt can contribute to raising blood pressure in some people. EATING AWAY FROM HOME Order entres, potatoes, and vegetables without sauces or butter. When meat exceeds the size of a deck of cards (3 to 4 ounces), the rest can be taken home for another meal. Choose vegetable or fruit salads and ask for low-calorie salad dressings to be served on the side. Use dressings  sparingly. Limit high-fat toppings, such as bacon, crumbled eggs, cheese, sunflower seeds, and olives. Ask for heart-healthy tub margarine instead of butter. Document Released: 09/20/2001 Document Re-Released: 06/25/2009 [facility] Patient Information 2011 [place], [place].Hypertension (High Blood Pressure) As your heart beats, it forces blood through your arteries. This force is your blood pressure. If the pressure is too high, it is called hypertension (HTN) or high blood pressure. HTN is dangerous because you may have it and not know it. High blood pressure may mean that your heart has to work harder to pump blood. Your arteries may be narrow or stiff. The extra work puts you at risk for heart disease, stroke, and other problems.   Blood pressure consists of two numbers, a higher number over a lower, 110/72, for example. It is stated as "110 over 72." The ideal is below 120 for the top number (systolic) and under 80 for the bottom (diastolic). Write down your blood pressure today. You should pay close attention to your blood pressure if you have certain conditions such as:  Heart failure.   Prior heart attack.     Diabetes    Chronic kidney disease.     Prior stroke.     Multiple risk factors for heart disease.     To see if you have HTN, your blood pressure should be measured while you are seated with your arm held at the level of the heart. It should be measured at least twice. A one-time elevated blood pressure reading (especially in the Emergency Department) does not mean that you need treatment. There may be conditions in which the blood pressure is different between your right and left arms. It is important to see your caregiver soon for a recheck. Most people have essential hypertension which means that there is not a specific cause. This type of high blood pressure may be lowered by changing lifestyle factors such as:  Stress.   Smoking.    Lack of exercise.     Excessive weight.    Drug/tobacco/alcohol use.     Eating less salt.     Most people do not have symptoms from high blood pressure until it has caused damage to the body. Effective treatment can often prevent, delay or reduce that damage. TREATMENT Treatment for high blood pressure, when a cause has been identified, is directed at the cause. There are a large number of medications to treat HTN. These fall into several categories, and your caregiver will help you select the medicines that are best for you. Medications may have side effects. You should review side effects with your caregiver. If your blood pressure stays high after you have made lifestyle changes or started on medicines,    Your medication(s) may need to be changed.   Other problems may need to be addressed.   Be certain you understand your prescriptions, and know how and when to take your medicine.   Be  sure to follow up with your caregiver within the time frame advised (usually within two weeks) to have your blood pressure rechecked and to review your medications.   If you are taking more than one medicine to lower your blood pressure, make sure you know how and at what times they should be taken. Taking two medicines at the same time can result in blood pressure that is too low.  SEEK IMMEDIATE MEDICAL CARE IF YOU DEVELOP:  A severe headache, blurred or changing vision, or confusion.   Unusual weakness or numbness, or a faint feeling.   Severe chest or abdominal pain, vomiting, or breathing problems.  MAKE SURE YOU:    Understand these instructions.   Will watch your condition.   Will get help right away if you are not doing well or get worse.  Document Released: 03/31/2005 Document Re-Released: 09/18/2009 [facility] Patient Information 2011 [place], [place].

## 2010-12-10 ENCOUNTER — Encounter: Payer: Self-pay | Admitting: Family Medicine

## 2010-12-10 MED ORDER — PRAVASTATIN SODIUM 40 MG PO TABS: 40.0000 mg | ORAL_TABLET | Freq: Every day | ORAL | Status: DC

## 2010-12-10 NOTE — Progress Notes (Signed)
  Subjective:    Patient ID: Alexander Gentry, male    DOB: 1970/07/25, 40 y.o.   MRN: [id number]  HPI Elevated BP measurements  Disease Monitoring  Blood pressure range: Not measuring at home  Chest pain: no   Dyspnea: no   Claudication: no   Medication compliance: On no medications for hypertension.  Was on a "Water" pill in past for hypertension Medication Side Effects  Lightheadedness: no   Urinary frequency: no   Edema: no   Impotence: no   Preventitive Healthcare:  Exercise: yes, weight lifting   Diet Pattern: Eats snack chips a lot.  High Fat content and High Sodium content diet.   Salt Restriction: Not currently  Medications, past medical history,  family history, social history were reviewed and updated.  Patient does not smoke, only occasional alcohol.   Review of SystemsSee HPI     Objective:   Physical Exam  Constitutional: He is cooperative.       Obese  Neck: No hepatojugular reflux and no JVD present. Carotid bruit is not present. No edema present. No mass and no thyromegaly present.  Cardiovascular: Normal rate, regular rhythm, S1 normal, normal heart sounds and intact distal pulses.  PMI is not displaced.   Pulmonary/Chest: Effort normal and breath sounds normal.  Abdominal: Soft. Normal appearance, normal aorta and bowel sounds are normal. There is no tenderness.  Neurological: He is alert.          Assessment & Plan:

## 2010-12-10 NOTE — Assessment & Plan Note (Signed)
New diagnosis of hypertension without evidence of end organ damage.  Patient likely had diagnosis of hypertension in past and was treated successfully with a "water" successfully and without significant adverse effects. Plan: Trial of HCTZ 12.5 mg daily.  Home BP monitoring. RTC in month to assess response and monitor for ADE. Low fat and low sodium (3 to 4 g salt diet daily) recommended.  Pt ed material about diet, and hypertension given to patient.  He declined nutrition consult.  He feel his RN wife will be able to educate him on eating healthy and avoiding high fat, high salt foods.  Encourgage aerobic exercise addtion to his exercise.  Goal of 7 pound weight loss over next 3 months.

## 2010-12-10 NOTE — Assessment & Plan Note (Signed)
Direct LDL cholesterol = 178 mg/dL.  Goal is LDL < 130 mg/dL. Plan: After discussion of risks and benefits, Alexander Gentry chose drug therapy by phone on 12/10/10.  Plan trial of Pravastatin 40 mg daily.  Given slightly elevated ALT will recheck hepatic panel and LDL-Direct on OV in 3 weeks. May need to check HBV and HCV serologies.  Will d/w pt on next OV.

## 2010-12-30 ENCOUNTER — Ambulatory Visit: Payer: 59 | Admitting: Family Medicine

## 2012-02-03 ENCOUNTER — Other Ambulatory Visit: Payer: Self-pay | Admitting: Family Medicine

## 2012-02-03 DIAGNOSIS — I10 Essential (primary) hypertension: Secondary | ICD-10-CM

## 2012-02-03 DIAGNOSIS — E782 Mixed hyperlipidemia: Secondary | ICD-10-CM

## 2012-02-03 MED ORDER — HYDROCHLOROTHIAZIDE 25 MG PO TABS: 12.5000 mg | ORAL_TABLET | Freq: Every day | ORAL | Status: DC

## 2012-02-03 MED ORDER — PRAVASTATIN SODIUM 40 MG PO TABS: 40.0000 mg | ORAL_TABLET | Freq: Every day | ORAL | Status: DC

## 2012-02-10 ENCOUNTER — Encounter: Payer: 59 | Admitting: Family Medicine

## 2012-04-05 ENCOUNTER — Ambulatory Visit: Payer: 59 | Admitting: Family Medicine

## 2012-04-06 ENCOUNTER — Other Ambulatory Visit: Payer: Self-pay | Admitting: Family Medicine

## 2012-04-06 DIAGNOSIS — I10 Essential (primary) hypertension: Secondary | ICD-10-CM

## 2012-04-06 DIAGNOSIS — E782 Mixed hyperlipidemia: Secondary | ICD-10-CM

## 2012-04-06 MED ORDER — PRAVASTATIN SODIUM 40 MG PO TABS: 40.0000 mg | ORAL_TABLET | Freq: Every day | ORAL | Status: DC

## 2012-04-06 MED ORDER — HYDROCHLOROTHIAZIDE 25 MG PO TABS: 12.5000 mg | ORAL_TABLET | Freq: Every day | ORAL | Status: DC

## 2012-04-14 HISTORY — PX: MENISCUS REPAIR: SHX5179

## 2012-04-14 HISTORY — PX: KNEE ARTHROSCOPY WITH ANTERIOR CRUCIATE LIGAMENT (ACL) REPAIR: SHX5644

## 2012-04-22 ENCOUNTER — Ambulatory Visit (INDEPENDENT_AMBULATORY_CARE_PROVIDER_SITE_OTHER): Payer: 59 | Admitting: Family Medicine

## 2012-04-22 ENCOUNTER — Encounter: Payer: Self-pay | Admitting: Family Medicine

## 2012-04-22 VITALS — BP 138/88 | HR 78 | Temp 97.8°F | Ht 68.0 in | Wt 213.0 lb

## 2012-04-22 DIAGNOSIS — Z79899 Other long term (current) drug therapy: Secondary | ICD-10-CM

## 2012-04-22 DIAGNOSIS — I10 Essential (primary) hypertension: Secondary | ICD-10-CM

## 2012-04-22 DIAGNOSIS — E782 Mixed hyperlipidemia: Secondary | ICD-10-CM

## 2012-04-22 DIAGNOSIS — R7401 Elevation of levels of liver transaminase levels: Secondary | ICD-10-CM

## 2012-04-22 LAB — COMPREHENSIVE METABOLIC PANEL
CO2: 25 mEq/L (ref 19–32)
Creat: 0.91 mg/dL (ref 0.50–1.35)
Glucose, Bld: 80 mg/dL (ref 70–99)
Total Bilirubin: 0.5 mg/dL (ref 0.3–1.2)
Total Protein: 7.4 g/dL (ref 6.0–8.3)

## 2012-04-22 LAB — HEPATITIS C ANTIBODY: HCV Ab: NEGATIVE

## 2012-04-22 MED ORDER — PRAVASTATIN SODIUM 40 MG PO TABS: 40.0000 mg | ORAL_TABLET | Freq: Every day | ORAL | Status: DC

## 2012-04-22 MED ORDER — HYDROCHLOROTHIAZIDE 25 MG PO TABS: 12.5000 mg | ORAL_TABLET | Freq: Every day | ORAL | Status: DC

## 2012-04-22 NOTE — Patient Instructions (Signed)
Your blood pressure looks good. Agree with mountain biking over 75 minutes a week to help with weight loss and blood pressure control.  Checking your cholesterol, your liver tests, your kidney test, your blood sugar and your electrolytes today.  You will receive the results of your lab results within 2 weeks. If you do not receive results; please call.  Keep taking your HCTZ and Pravastin. Start your daily baby aspirin to prevent heart attacks and strokes.

## 2012-04-22 NOTE — Progress Notes (Signed)
  Subjective:    Patient ID: Alexander Gentry, male    DOB: 13-Oct-1970, 42 y.o.   MRN: [id number]  HPI CHRONIC HYPERTENSION  Disease Monitoring  Blood pressure range: Not checking at home  Chest pain: no   Dyspnea: no   Claudication: no   Medication compliance: yes  Medication Side Effects  Lightheadedness: no   Urinary frequency: no   Edema: no   Impotence: no   Preventitive Healthcare:  Exercise: yes, riding mountain bikes   Diet Pattern: avoiding shaken salt.    HYPERLIPIDEMIA Disease Monitoring: See symptoms for Hypertension Medications: Pravastatin  Compliance- yes Right upper quadrant pain- no  Muscle aches- no  ROS See HPI above   PMH Smoking Status noted  Lab Review   Potassium  Date Value Range Status  12/09/2010 4.0  3.5 - 5.3 mEq/L Final     Sodium  Date Value Range Status  12/09/2010 139  135 - 145 mEq/L Final     Creat  Date Value Range Status  12/09/2010 0.93  0.50 - 1.35 mg/dL Final     Creatinine, Ser  Date Value Range Status  04/22/2010 1.00  0.40-1.50 mg/dL Final       Review of Systems see HPI     Objective:   Physical Exam Gen: Groomed, cooperative, NAD  Filed Vitals:   04/22/12 1100  BP: 138/88  Pulse: 78  Temp: 97.8 F (36.6 C)  Heart - Regular rate and rhythm.  No murmurs, gallops or rubs.    Lungs:  Normal respiratory effort, chest expands symmetrically. Lungs are clear to auscultation, no crackles or wheezes. Extremities:  No cyanosis, edema, or deformity noted with good range of motion of all major joints.   Palpable pulses bilateral feet. No edema of feet or ankles.      Assessment & Plan:

## 2012-04-22 NOTE — Assessment & Plan Note (Signed)
Adequate blood pressure control.  No evidence of new end organ damage.  Tolerating medication without significant adverse effects.  Plan to continue current blood pressure regiment.   

## 2012-04-22 NOTE — Assessment & Plan Note (Signed)
Tolerating pravastatin 40 mg daily.  No new end organ damage clinically. Checking LDL today. Plan to continue regiment.

## 2012-04-23 ENCOUNTER — Encounter: Payer: Self-pay | Admitting: Family Medicine

## 2012-12-06 ENCOUNTER — Encounter: Payer: Self-pay | Admitting: Family Medicine

## 2012-12-06 ENCOUNTER — Ambulatory Visit (INDEPENDENT_AMBULATORY_CARE_PROVIDER_SITE_OTHER): Payer: 59 | Admitting: Family Medicine

## 2012-12-06 VITALS — BP 129/87 | HR 86 | Temp 98.2°F | Wt 213.0 lb

## 2012-12-06 DIAGNOSIS — I10 Essential (primary) hypertension: Secondary | ICD-10-CM

## 2012-12-06 DIAGNOSIS — E785 Hyperlipidemia, unspecified: Secondary | ICD-10-CM

## 2012-12-06 DIAGNOSIS — Z23 Encounter for immunization: Secondary | ICD-10-CM

## 2012-12-06 DIAGNOSIS — Z79899 Other long term (current) drug therapy: Secondary | ICD-10-CM

## 2012-12-06 DIAGNOSIS — E782 Mixed hyperlipidemia: Secondary | ICD-10-CM

## 2012-12-06 DIAGNOSIS — R7401 Elevation of levels of liver transaminase levels: Secondary | ICD-10-CM

## 2012-12-06 DIAGNOSIS — R7402 Elevation of levels of lactic acid dehydrogenase (LDH): Secondary | ICD-10-CM

## 2012-12-06 DIAGNOSIS — IMO0002 Reserved for concepts with insufficient information to code with codable children: Secondary | ICD-10-CM

## 2012-12-06 LAB — COMPREHENSIVE METABOLIC PANEL
AST: 17 U/L (ref 0–37)
Albumin: 4.6 g/dL (ref 3.5–5.2)
Alkaline Phosphatase: 111 U/L (ref 39–117)
BUN: 11 mg/dL (ref 6–23)
Glucose, Bld: 91 mg/dL (ref 70–99)
Potassium: 3.5 mEq/L (ref 3.5–5.3)
Total Bilirubin: 0.5 mg/dL (ref 0.3–1.2)

## 2012-12-07 ENCOUNTER — Encounter: Payer: Self-pay | Admitting: Family Medicine

## 2012-12-07 DIAGNOSIS — R7401 Elevation of levels of liver transaminase levels: Secondary | ICD-10-CM | POA: Insufficient documentation

## 2012-12-07 HISTORY — DX: Elevation of levels of liver transaminase levels: R74.01

## 2012-12-07 NOTE — Assessment & Plan Note (Addendum)
Tolerating pravastatin 40 mg daily. No new end organ damage clinically.   Results for Alexander Gentry, Alexander Gentry (MRN [id number]) as of 12/07/2012 10:20  Ref. Range 12/06/2012 15:33  Direct LDL No range found 125 (H)   Plan to continue regiment of moderate intensity statin.

## 2012-12-07 NOTE — Assessment & Plan Note (Signed)
Adequate blood pressure control.  No evidence of new end organ damage.  Tolerating medication without significant adverse effects.  Plan to continue current blood pressure regiment.   

## 2012-12-07 NOTE — Assessment & Plan Note (Signed)
Results for JAVONNI, MACKE (MRN [id number]) as of 12/07/2012 10:20  Ref. Range 12/06/2012 15:33  AST Latest Range: 0-37 U/L 17  ALT Latest Range: 0-53 U/L 34   Resolved elevated ALT. No further work up for now.  HCV ab was negative.
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
## 2013-11-17 ENCOUNTER — Encounter: Payer: Self-pay | Admitting: Family Medicine

## 2013-11-17 ENCOUNTER — Ambulatory Visit (INDEPENDENT_AMBULATORY_CARE_PROVIDER_SITE_OTHER): Payer: BC Managed Care – PPO | Admitting: Family Medicine

## 2013-11-17 VITALS — BP 145/95 | HR 71 | Temp 98.1°F | Wt 199.0 lb

## 2013-11-17 DIAGNOSIS — G8929 Other chronic pain: Secondary | ICD-10-CM | POA: Insufficient documentation

## 2013-11-17 DIAGNOSIS — M25561 Pain in right knee: Secondary | ICD-10-CM

## 2013-11-17 DIAGNOSIS — E782 Mixed hyperlipidemia: Secondary | ICD-10-CM

## 2013-11-17 DIAGNOSIS — Z79899 Other long term (current) drug therapy: Secondary | ICD-10-CM

## 2013-11-17 DIAGNOSIS — M25569 Pain in unspecified knee: Secondary | ICD-10-CM

## 2013-11-17 DIAGNOSIS — I10 Essential (primary) hypertension: Secondary | ICD-10-CM

## 2013-11-17 DIAGNOSIS — R7402 Elevation of levels of lactic acid dehydrogenase (LDH): Secondary | ICD-10-CM

## 2013-11-17 DIAGNOSIS — R7401 Elevation of levels of liver transaminase levels: Secondary | ICD-10-CM

## 2013-11-17 DIAGNOSIS — R740 Nonspecific elevation of levels of transaminase and lactic acid dehydrogenase [LDH]: Principal | ICD-10-CM

## 2013-11-17 HISTORY — DX: Other chronic pain: G89.29

## 2013-11-17 LAB — COMPREHENSIVE METABOLIC PANEL
ALT: 23 U/L (ref 0–53)
AST: 17 U/L (ref 0–37)
Albumin: 4.4 g/dL (ref 3.5–5.2)
Alkaline Phosphatase: 93 U/L (ref 39–117)
BILIRUBIN TOTAL: 0.6 mg/dL (ref 0.2–1.2)
BUN: 13 mg/dL (ref 6–23)
CO2: 29 mEq/L (ref 19–32)
CREATININE: 0.9 mg/dL (ref 0.50–1.35)
Calcium: 9.1 mg/dL (ref 8.4–10.5)
Chloride: 104 mEq/L (ref 96–112)
Glucose, Bld: 91 mg/dL (ref 70–99)
Potassium: 4.1 mEq/L (ref 3.5–5.3)
Sodium: 141 mEq/L (ref 135–145)
Total Protein: 7.2 g/dL (ref 6.0–8.3)

## 2013-11-17 LAB — LIPID PANEL
CHOL/HDL RATIO: 4.8 ratio
Cholesterol: 239 mg/dL — ABNORMAL HIGH (ref 0–200)
HDL: 50 mg/dL (ref >39)
LDL Cholesterol: 174 mg/dL — ABNORMAL HIGH (ref 0–99)
Triglycerides: 77 mg/dL (ref <150)
VLDL: 15 mg/dL (ref 0–40)

## 2013-11-17 MED ORDER — PRAVASTATIN SODIUM 40 MG PO TABS: 40.0000 mg | ORAL_TABLET | Freq: Every day | ORAL | Status: DC

## 2013-11-17 MED ORDER — HYDROCHLOROTHIAZIDE 25 MG PO TABS: 12.5000 mg | ORAL_TABLET | Freq: Every day | ORAL | Status: DC

## 2013-11-17 NOTE — Assessment & Plan Note (Signed)
ACC/AHA 10-year CV event risk = 8.1% (11/17/13) Recommend restart of Pravastatin 40 mg at bedtime

## 2013-11-17 NOTE — Assessment & Plan Note (Signed)
Worsened. Recommend restart of HCTZ 25 mg tab, 0.5 tab daily. Monitor home BP. If home BP > 135/85 on average, then increase HCTZ to 25 mg daily Increase potassium rich food in diet.  Continue aerobic exercise 4 times a week. Discuss sodium restriction.  Check BMET next office visit in 6 weeks and check BP response and medication tolerance.
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
## 2013-11-17 NOTE — Assessment & Plan Note (Signed)
Suspect some post operative persistent swelling.  There may be a component of early degenerative joint disease of knee following trauma.  Not interfering with work or recreation.  Recommended step approach to analgesics: 1) as needed APAP, 2) Schedule APAP, 3) As needed NSAID for breakthrough pain 4) schedule longacting NSAID.  Discussed role of corticosteroid injections / synovial fluid substitutes, assistive devices and surgical replacement.

## 2013-11-17 NOTE — Patient Instructions (Signed)
Your weight is down 14 pounds, good job!  Your blood pressure is in the hypertension range.  I recommend you restart your Hydrochlorothiazide (HCTZ)  and restart your Pravastatin for your high cholesterol  Restart monitoring your home Blood pressure monitoring.  Your Home Blood Pressure goal is less than 135/ 85.  If your blood pressure at home on average is staying above 135/85, then increase your HCTZ to one whole tablet a day.   Eat lots of fruits / dry fruits to keep potassium up because your HCTZ will pull potassium out of your body.   Keep taking a baby aspirin (81 mg tablet) every day.

## 2013-11-21 ENCOUNTER — Encounter: Payer: Self-pay | Admitting: Family Medicine

## 2014-01-05 ENCOUNTER — Other Ambulatory Visit: Payer: Self-pay | Admitting: *Deleted

## 2014-01-05 ENCOUNTER — Encounter: Payer: Self-pay | Admitting: Family Medicine

## 2014-01-05 ENCOUNTER — Ambulatory Visit (INDEPENDENT_AMBULATORY_CARE_PROVIDER_SITE_OTHER): Payer: BC Managed Care – PPO | Admitting: Family Medicine

## 2014-01-05 VITALS — BP 120/84 | HR 60 | Temp 98.2°F | Ht 68.0 in | Wt 202.0 lb

## 2014-01-05 DIAGNOSIS — Z23 Encounter for immunization: Secondary | ICD-10-CM

## 2014-01-05 DIAGNOSIS — Z79899 Other long term (current) drug therapy: Secondary | ICD-10-CM | POA: Diagnosis not present

## 2014-01-05 DIAGNOSIS — M25561 Pain in right knee: Secondary | ICD-10-CM

## 2014-01-05 DIAGNOSIS — E782 Mixed hyperlipidemia: Secondary | ICD-10-CM

## 2014-01-05 DIAGNOSIS — I10 Essential (primary) hypertension: Secondary | ICD-10-CM | POA: Diagnosis not present

## 2014-01-05 DIAGNOSIS — M25569 Pain in unspecified knee: Secondary | ICD-10-CM

## 2014-01-05 DIAGNOSIS — G8929 Other chronic pain: Secondary | ICD-10-CM

## 2014-01-05 LAB — LDL CHOLESTEROL, DIRECT: Direct LDL: 192 mg/dL — ABNORMAL HIGH

## 2014-01-05 LAB — BASIC METABOLIC PANEL
BUN: 12 mg/dL (ref 6–23)
CO2: 25 mEq/L (ref 19–32)
Calcium: 9.3 mg/dL (ref 8.4–10.5)
Chloride: 101 mEq/L (ref 96–112)
Creat: 0.92 mg/dL (ref 0.50–1.35)
Glucose, Bld: 77 mg/dL (ref 70–99)
POTASSIUM: 3.9 meq/L (ref 3.5–5.3)
SODIUM: 136 meq/L (ref 135–145)

## 2014-01-05 MED ORDER — HYDROCHLOROTHIAZIDE 25 MG PO TABS: 12.5000 mg | ORAL_TABLET | Freq: Every day | ORAL | Status: DC

## 2014-01-05 MED ORDER — DICLOFENAC SODIUM 1 % TD GEL: 4.0000 g | Freq: Four times a day (QID) | TRANSDERMAL | Status: DC

## 2014-01-05 NOTE — Patient Instructions (Addendum)
We are checking how your LDL (Bad) cholesterol today to see if the Fish oil/Krill oil therapy is working adequately for you.  YOur blood pressure responded very well to the HCTZ half tablet daily.  Would recommend you continue it.  We are checking your blood potassium today to make sure the HCTZ is not removing too much poltassium from body.

## 2014-01-05 NOTE — Assessment & Plan Note (Signed)
ACC/AHA 10-year CV event risk = 8.1% (11/17/13)  Recommend restart of Pravastatin 40 mg start 11/17/13  Patient stopped the pravastatin two weeks ago after reading about ADE of statins.   Plan to check LDL on Fish oil therapy that patient self-started.  If inadequate reduction in LDL, pt will consider starting back the pravastatin.

## 2014-01-05 NOTE — Assessment & Plan Note (Signed)
Inadequate response to as needed oral APAP and /or  NSAID. Try trial of topical Diclofenac QID to right knee.  Continue Icing and exercise.  Recommended use of cane when pain significant.   Pt interested in reconsulting with PT but concerned about cost of copay.

## 2014-01-05 NOTE — Progress Notes (Signed)
   Subjective:    Patient ID: Alexander Gentry, male    DOB: Aug 22, 1970, 43 y.o.   MRN: [id number]  HPI HYPERTENSION  Disease Monitoring: Blood pressure range- less than 135<85 Chest pain- no      Dyspnea- no  Medications: Compliance- yes, taking HCTZ 25 mg tab, half tab daily Lightheadedness- no   Edema- no Erectile dysfunction: no Not exercising as much as before. Planning on getting back to going to gymnasium   HYPERLIPIDEMIA  Disease Monitoring: See symptoms for Hypertension  Medications: Compliance- Stopped taking statin after reading about adverse effects on line.  Started Taking a [employer] Fish oil and Krill oil capsule, two capsules a day.  RUQ pain- no  Muscle aches- no  Right knee pain  - Still intermittent pain in anterior right knee.  - Onset S/p right knee cadaveric ACL and meniscectomy repairs last year. Has completed PT.  - Pain after prolonged activity and work. Swelling in right knee after work or activity - self-tx with icing and prn APAP or ibuprofen and neoprean knee sleeve with fair response.  - occasional catching sensation in right knee. No locking. No giving way.        ROS See HPI above   PMH Smoking Status noted         Review of Systems     Objective:   Physical Exam Physical Exam  Constitutional: He appears well-developed and well-nourished. No distress.  HENT:  Right Ear: External ear normal.  Left Ear: External ear normal.  Neck: No thyromegaly present.  Cardiovascular: Normal rate, regular rhythm and normal heart sounds.  No murmur heard.  Pulses:  Popliteal pulses are 2+ on the right side, and 2+ on the left side.  Dorsalis pedis pulses are 2+ on the right side, and 2+ on the left side.  Pulmonary/Chest: Effort normal and breath sounds normal.  Abdominal: Soft. Bowel sounds are normal. He exhibits no distension and no mass. There is no tenderness.  Musculoskeletal: He exhibits no edema.  Right knee: He exhibits no swllings of  right knee.He exhibits no effusion, no deformity and no bony tenderness. No tenderness found. No medial joint line, no lateral joint line, no MCL, no LCL and no patellar tendon tenderness noted.  Lymphadenopathy:  He has no cervical adenopathy        Assessment & Plan:  Hypertension goal BP (blood pressure) < 140/90 Improved.  Adequate blood pressure control.  No evidence of new end organ damage.  Tolerating medication without significant adverse effects.  Plan to continue current blood pressure regiment HCTZ 12.5 mg daily. Checking bmet for adverse medication effect.   HYPERLIPIDEMIA ACC/AHA 10-year CV event risk = 8.1% (11/17/13)  Recommend restart of Pravastatin 40 mg start 11/17/13  Patient stopped the pravastatin two weeks ago after reading about ADE of statins.   Plan to check LDL on Fish oil therapy that patient self-started.  If inadequate reduction in LDL, pt will consider starting back the pravastatin.     Chronic pain of right knee Inadequate response to as needed oral APAP and /or  NSAID. Try trial of topical Diclofenac QID to right knee.  Continue Icing and exercise.  Recommended use of cane when pain significant.   Pt interested in reconsulting with PT but concerned about cost of copay.

## 2014-01-05 NOTE — Assessment & Plan Note (Signed)
Improved.  Adequate blood pressure control.  No evidence of new end organ damage.  Tolerating medication without significant adverse effects.  Plan to continue current blood pressure regiment HCTZ 12.5 mg daily. Checking bmet for adverse medication effect.

## 2014-01-06 ENCOUNTER — Encounter: Payer: Self-pay | Admitting: Family Medicine

## 2014-01-06 ENCOUNTER — Telehealth: Payer: Self-pay | Admitting: Family Medicine

## 2014-01-06 DIAGNOSIS — M25561 Pain in right knee: Principal | ICD-10-CM

## 2014-01-06 DIAGNOSIS — G8929 Other chronic pain: Secondary | ICD-10-CM

## 2014-01-06 DIAGNOSIS — E782 Mixed hyperlipidemia: Secondary | ICD-10-CM

## 2014-01-06 MED ORDER — DICLOFENAC SODIUM 1 % TD GEL: 4.0000 g | Freq: Four times a day (QID) | TRANSDERMAL | Status: DC

## 2014-01-06 NOTE — Telephone Encounter (Signed)
Pt agreed to restart pravastatin. He may continue his fish oil

## 2014-01-06 NOTE — Progress Notes (Addendum)
Patient ID: Alexander Gentry, male   DOB: [birth year], 42 y.o.   MRN: [id number] [facility] electronic- prior approved patient's voltaren Gel prescription.   Please let patient know I re-sent the approved Rx for Voltaren Gel to patient's pharmacy at [facility] on [address].

## 2014-01-06 NOTE — Progress Notes (Signed)
LM with male who answered the phone to have pt call back. Darlisha Kelm, Salome Spotted

## 2014-01-06 NOTE — Addendum Note (Signed)
Addended byWendy Poet, TODD D on: 01/06/2014 02:45 PM   Modules accepted: Orders, Medications

## 2014-01-06 NOTE — Progress Notes (Signed)
Patient ID: Alexander Gentry, male   DOB: 1970-06-04, 43 y.o.   MRN: [id number] Submitted Prior Authorization to [employer] using online [email or web address] For Voltaren Gel 1% as patient has not had repsonse to Ibuprofen nor Naprosyn for right knee osteoarthritis pain

## 2014-01-06 NOTE — Assessment & Plan Note (Addendum)
Lab Results  Component Value Date   CHOL 239* 11/17/2013   HDL 50 11/17/2013   LDLCALC 174* 11/17/2013   LDLDIRECT 192* 01/05/2014   TRIG 77 11/17/2013   CHOLHDL 4.8 11/17/2013   After discussion, Mr Hubble said he would restart his Pravastatin.

## 2014-01-09 ENCOUNTER — Other Ambulatory Visit: Payer: Self-pay | Admitting: *Deleted

## 2014-01-09 DIAGNOSIS — E782 Mixed hyperlipidemia: Secondary | ICD-10-CM

## 2014-01-09 DIAGNOSIS — I10 Essential (primary) hypertension: Secondary | ICD-10-CM

## 2014-01-09 MED ORDER — PRAVASTATIN SODIUM 40 MG PO TABS
40.0000 mg | ORAL_TABLET | Freq: Every day | ORAL | Status: DC
Start: 2014-01-09 — End: 2015-02-16

## 2014-01-25 ENCOUNTER — Telehealth: Payer: Self-pay | Admitting: Family Medicine

## 2014-01-25 DIAGNOSIS — G8929 Other chronic pain: Secondary | ICD-10-CM

## 2014-01-25 DIAGNOSIS — M25561 Pain in right knee: Principal | ICD-10-CM

## 2014-01-25 NOTE — Telephone Encounter (Signed)
Asking to speak with MD about problems he is having with his leg, says he was told to call if he had any problems.

## 2014-01-27 NOTE — Telephone Encounter (Signed)
Patient requesting Physical therapy for his right knee.  I have seen patient previously for this issue and had told him I would make a referral for PT if he decided to pursue it.

## 2014-02-08 ENCOUNTER — Ambulatory Visit: Payer: BC Managed Care – PPO | Attending: Family Medicine

## 2014-02-08 DIAGNOSIS — Z9889 Other specified postprocedural states: Secondary | ICD-10-CM | POA: Diagnosis not present

## 2014-02-08 DIAGNOSIS — I10 Essential (primary) hypertension: Secondary | ICD-10-CM | POA: Diagnosis not present

## 2014-02-08 DIAGNOSIS — M79604 Pain in right leg: Secondary | ICD-10-CM | POA: Diagnosis not present

## 2014-02-08 DIAGNOSIS — M25561 Pain in right knee: Secondary | ICD-10-CM | POA: Diagnosis not present

## 2014-02-08 DIAGNOSIS — Z5189 Encounter for other specified aftercare: Secondary | ICD-10-CM | POA: Insufficient documentation

## 2014-02-27 ENCOUNTER — Ambulatory Visit: Payer: BC Managed Care – PPO

## 2014-03-02 ENCOUNTER — Ambulatory Visit: Payer: BC Managed Care – PPO | Attending: Family Medicine

## 2014-03-02 DIAGNOSIS — Z5189 Encounter for other specified aftercare: Secondary | ICD-10-CM | POA: Insufficient documentation

## 2014-03-02 DIAGNOSIS — Z9889 Other specified postprocedural states: Secondary | ICD-10-CM | POA: Insufficient documentation

## 2014-03-02 DIAGNOSIS — I10 Essential (primary) hypertension: Secondary | ICD-10-CM | POA: Insufficient documentation

## 2014-03-02 DIAGNOSIS — M79604 Pain in right leg: Secondary | ICD-10-CM | POA: Insufficient documentation

## 2014-03-02 DIAGNOSIS — M25561 Pain in right knee: Secondary | ICD-10-CM | POA: Insufficient documentation

## 2014-03-06 ENCOUNTER — Ambulatory Visit: Payer: BC Managed Care – PPO | Admitting: Rehabilitation

## 2014-03-08 ENCOUNTER — Ambulatory Visit: Payer: BC Managed Care – PPO

## 2014-03-23 NOTE — Therapy (Signed)
Outpatient Rehabilitation [facility] [address] [place], [place], [address] Phone: [phone]   Fax:  [phone]  Patient Details  Name: Alexander Gentry MRN: [id number] Date of Birth: June 22, 1970  Encounter Date: 03/23/2014  PHYSICAL THERAPY DISCHARGE SUMMARY  Visits from Start of Care: 1  Pt was inititally evaluated on 02/08/14 for R knee pain. Pt has not returned to PT since evaluation visit, and no further appts are scheduled at this time. Therefore, pt will be discharged from PT. Current status of goals below are unknown.   Current functional level related to goals / functional outcomes:  No Goals Met: LTGs:   1. Independent with a HEP 2. R knee AAROM will improve to 0-130 decrees. 3. R knee strength will improve by 1/2 grade.  4. Pain will decrease to 1/10 with functional activities. 5 .FOTO will improve from 58% limited to 38% limited.    Remaining deficits: Unknown due to no further visits.    Education / Equipment: HEP established at Evaluation.   Plan:                                                    Patient goals were not met. Patient is being discharged due to not returning since the last visit.  ?????        Dollene Cleveland, PT, DPT 03/23/2014 5:09 PM Phone: [phone] Fax: [phone]

## 2014-05-19 ENCOUNTER — Ambulatory Visit (INDEPENDENT_AMBULATORY_CARE_PROVIDER_SITE_OTHER): Payer: Self-pay | Admitting: *Deleted

## 2014-05-19 DIAGNOSIS — Z111 Encounter for screening for respiratory tuberculosis: Secondary | ICD-10-CM

## 2014-05-19 NOTE — Progress Notes (Signed)
   PPD placed Left Forearm.  Pt to return 05/22/2014 Monday for reading.  Pt tolerated intradermal injection. Derl Barrow, RN

## 2014-05-22 ENCOUNTER — Encounter: Payer: Self-pay | Admitting: *Deleted

## 2014-05-22 ENCOUNTER — Ambulatory Visit (INDEPENDENT_AMBULATORY_CARE_PROVIDER_SITE_OTHER): Payer: BLUE CROSS/BLUE SHIELD | Admitting: *Deleted

## 2014-05-22 DIAGNOSIS — Z111 Encounter for screening for respiratory tuberculosis: Secondary | ICD-10-CM

## 2014-05-22 DIAGNOSIS — Z7689 Persons encountering health services in other specified circumstances: Secondary | ICD-10-CM

## 2014-05-22 LAB — TB SKIN TEST
INDURATION: 0 mm
TB SKIN TEST: NEGATIVE

## 2014-05-22 NOTE — Progress Notes (Signed)
   PPD Reading Note PPD read and results entered in EpicCare. Result: 0 mm induration. Interpretation: Negative If test not read within 48-72 hours of initial placement, patient advised to repeat in other arm 1-3 weeks after this test. Allergic reaction: no  Martin, Tamika L, RN  

## 2015-02-15 ENCOUNTER — Ambulatory Visit (INDEPENDENT_AMBULATORY_CARE_PROVIDER_SITE_OTHER): Payer: BLUE CROSS/BLUE SHIELD | Admitting: Family Medicine

## 2015-02-15 ENCOUNTER — Encounter: Payer: Self-pay | Admitting: Family Medicine

## 2015-02-15 VITALS — BP 134/76 | HR 72 | Temp 98.1°F | Ht 67.0 in | Wt 219.5 lb

## 2015-02-15 DIAGNOSIS — Z23 Encounter for immunization: Secondary | ICD-10-CM | POA: Diagnosis not present

## 2015-02-15 DIAGNOSIS — I10 Essential (primary) hypertension: Secondary | ICD-10-CM | POA: Diagnosis not present

## 2015-02-15 DIAGNOSIS — E785 Hyperlipidemia, unspecified: Secondary | ICD-10-CM

## 2015-02-15 LAB — COMPREHENSIVE METABOLIC PANEL
ALT: 55 U/L — ABNORMAL HIGH (ref 9–46)
AST: 31 U/L (ref 10–40)
Albumin: 4.1 g/dL (ref 3.6–5.1)
Alkaline Phosphatase: 97 U/L (ref 40–115)
BILIRUBIN TOTAL: 0.4 mg/dL (ref 0.2–1.2)
BUN: 14 mg/dL (ref 7–25)
CHLORIDE: 103 mmol/L (ref 98–110)
CO2: 29 mmol/L (ref 20–31)
CREATININE: 1.1 mg/dL (ref 0.60–1.35)
Calcium: 9.1 mg/dL (ref 8.6–10.3)
GLUCOSE: 79 mg/dL (ref 65–99)
Potassium: 3.9 mmol/L (ref 3.5–5.3)
SODIUM: 140 mmol/L (ref 135–146)
Total Protein: 7 g/dL (ref 6.1–8.1)

## 2015-02-15 LAB — LIPID PANEL
CHOL/HDL RATIO: 3.7 ratio (ref <=5.0)
Cholesterol: 201 mg/dL — ABNORMAL HIGH (ref 125–200)
HDL: 54 mg/dL (ref >=40)
LDL Cholesterol: 130 mg/dL — ABNORMAL HIGH (ref <130)
Triglycerides: 83 mg/dL (ref <150)
VLDL: 17 mg/dL (ref <30)

## 2015-02-16 ENCOUNTER — Encounter: Payer: Self-pay | Admitting: Family Medicine

## 2015-02-16 ENCOUNTER — Other Ambulatory Visit: Payer: Self-pay | Admitting: Family Medicine

## 2015-02-16 DIAGNOSIS — E782 Mixed hyperlipidemia: Secondary | ICD-10-CM

## 2015-02-16 DIAGNOSIS — I10 Essential (primary) hypertension: Secondary | ICD-10-CM

## 2015-02-16 NOTE — Progress Notes (Signed)
   Subjective:    Patient ID: Alexander Gentry, male    DOB: December 08, 1970, 44 y.o.   MRN: [id number]  Hypertension   Problem List Items Addressed This Visit     Cardiovascular and Mediastinum   Hypertension goal BP (blood pressure) < 140/90 (Chronic) - Longstanding issue - Pt reports that he is taking his HCTZ and Pravastatin - No chest pain, no claudiaction, no SHORTNESS OF BREATH - Not measuring his BP at home - Exercising at gym     Other   HYPERLIPIDEMIA - Longstanding problem - Previously on pravastatin without proble. - Has gained weight because not  Exercising as much and eating more -- Denies myalgias and RUQ pain   SH: No Smoking  Review of Systems See HPI     Objective:   Physical Exam  Constitutional: He appears well-developed and well-nourished. No distress.  HENT:  Right Ear: External ear normal.  Left Ear: External ear normal.  Neck: No thyromegaly present.  Cardiovascular: Normal rate, regular rhythm and normal heart sounds.   No murmur heard. Pulses:      Popliteal pulses are 2+ on the right side, and 2+ on the left side.       Dorsalis pedis pulses are 2+ on the right side, and 2+ on the left side.  Pulmonary/Chest: Effort normal and breath sounds normal.  Abdominal: Soft. Bowel sounds are normal. He exhibits no distension and no mass. There is no tenderness.  Musculoskeletal: He exhibits no edema.       Right knee: Swelling: mild loss peripatella fossa.  Tenderness: peripatella.  Lymphadenopathy:    He has no cervical adenopathy.          Assessment & Plan:

## 2015-02-16 NOTE — Assessment & Plan Note (Signed)
Established problem At goal Tolerating pravastatin Continue therapy

## 2015-02-16 NOTE — Assessment & Plan Note (Signed)
Established problem that is stable Continue HCTZ  And [facility] Lab order for complications of high risk medication.

## 2015-02-16 NOTE — Telephone Encounter (Signed)
Pt called and needs refill on his BP medication and also his cholesterol medication. He was seen yesterday and forgot to tell the doctor about needing refills. jw

## 2015-02-19 ENCOUNTER — Encounter: Payer: Self-pay | Admitting: Family Medicine

## 2015-02-19 MED ORDER — HYDROCHLOROTHIAZIDE 25 MG PO TABS: 12.5000 mg | ORAL_TABLET | Freq: Every day | ORAL | Status: DC

## 2015-02-19 MED ORDER — PRAVASTATIN SODIUM 40 MG PO TABS: 40.0000 mg | ORAL_TABLET | Freq: Every day | ORAL | Status: DC

## 2015-04-17 ENCOUNTER — Other Ambulatory Visit: Payer: Self-pay | Admitting: *Deleted

## 2015-04-17 DIAGNOSIS — E782 Mixed hyperlipidemia: Secondary | ICD-10-CM

## 2015-04-17 DIAGNOSIS — I10 Essential (primary) hypertension: Secondary | ICD-10-CM

## 2015-04-17 MED ORDER — HYDROCHLOROTHIAZIDE 25 MG PO TABS
12.5000 mg | ORAL_TABLET | Freq: Every day | ORAL | Status: DC
Start: 2015-04-17 — End: 2015-12-24

## 2015-04-17 MED ORDER — PRAVASTATIN SODIUM 40 MG PO TABS: 40.0000 mg | ORAL_TABLET | Freq: Every day | ORAL | Status: DC

## 2015-04-17 NOTE — Telephone Encounter (Addendum)
[facility] requesting 90 day supply of HCTZ and pravastatin. Previous refills of HCTZ and pravastatin were sent to [facility]. Velora Heckler, RN

## 2015-04-26 ENCOUNTER — Other Ambulatory Visit: Payer: Self-pay | Admitting: *Deleted

## 2015-04-26 DIAGNOSIS — E782 Mixed hyperlipidemia: Secondary | ICD-10-CM

## 2015-04-26 DIAGNOSIS — I10 Essential (primary) hypertension: Secondary | ICD-10-CM

## 2015-04-26 MED ORDER — PRAVASTATIN SODIUM 40 MG PO TABS: 40.0000 mg | ORAL_TABLET | Freq: Every day | ORAL | Status: DC

## 2015-04-26 MED ORDER — HYDROCHLOROTHIAZIDE 25 MG PO TABS: 25.0000 mg | ORAL_TABLET | Freq: Every day | ORAL | Status: DC

## 2015-12-06 ENCOUNTER — Ambulatory Visit: Payer: BLUE CROSS/BLUE SHIELD | Admitting: Family Medicine

## 2015-12-20 ENCOUNTER — Encounter: Payer: Self-pay | Admitting: Family Medicine

## 2015-12-20 ENCOUNTER — Ambulatory Visit (INDEPENDENT_AMBULATORY_CARE_PROVIDER_SITE_OTHER): Payer: BLUE CROSS/BLUE SHIELD | Admitting: Family Medicine

## 2015-12-20 VITALS — BP 136/82 | HR 68 | Temp 98.2°F | Wt 212.0 lb

## 2015-12-20 DIAGNOSIS — Z23 Encounter for immunization: Secondary | ICD-10-CM

## 2015-12-20 DIAGNOSIS — Z114 Encounter for screening for human immunodeficiency virus [HIV]: Secondary | ICD-10-CM | POA: Diagnosis not present

## 2015-12-20 DIAGNOSIS — Z79899 Other long term (current) drug therapy: Secondary | ICD-10-CM | POA: Diagnosis not present

## 2015-12-20 DIAGNOSIS — E785 Hyperlipidemia, unspecified: Secondary | ICD-10-CM | POA: Diagnosis not present

## 2015-12-20 DIAGNOSIS — I10 Essential (primary) hypertension: Secondary | ICD-10-CM

## 2015-12-20 NOTE — Progress Notes (Signed)
   Subjective:    Patient ID: Alexander Gentry, male    DOB: 11-19-1970, 45 y.o.   MRN: [id number] Alexander Gentry is alone Sources of clinical information for visit is/are patient and past medical records. Nursing assessment for this office visit was reviewed with the patient for accuracy and revision.   HPI  HYPERTENSION  Disease Monitoring: Blood pressure range-not checking Chest pain- no      Dyspnea- no  Medications: Compliance- yes Lightheadedness- no   Edema- no Erectile dysfunction: no     HYPERLIPIDEMIA  Disease Monitoring: See symptoms for Hypertension  Medications: Compliance- yes pravastatin RUQ pain- no  Muscle aches- no    ROS See HPI above   PMH Smoking Status noted       Review of Systems     Objective:   Physical Exam VS reviewed GEN: Alert, Cooperative, Groomed, NAD HEENT: PERRL; EAC bilaterally not occluded, TM's translucent with normal LM, (+) LR;                No cervical LAN, No thyromegaly, No palpable masses COR: RRR, No M/G/R, No JVD, Normal PMI size and location LUNGS: BCTA, No Acc mm use, speaking in full sentences ABDOMEN: (+)BS, soft, NT, ND, No HSM, No palpable masses EXT: No peripheral leg edema. Feet without deformity or lesions. Palpable bilateral pedal pulses. bil flat feet  SKIN: No lesion nor rashes of face/trunk/extremities Gait: Normal speed, No significant path deviation, Step through +,  Psych: Normal affect/thought/speech/language        Assessment & Plan:

## 2015-12-20 NOTE — Assessment & Plan Note (Addendum)
Established problem Results for Alexander Gentry, Alexander Gentry (MRN [id number]) as of 12/24/2015 09:25  Ref. Range 12/20/2015 12:22  Direct LDL Latest Ref Range: <130 mg/dL 204 (H)   Uncontrolled Continue pravastatin 40 mg daily

## 2015-12-20 NOTE — Assessment & Plan Note (Signed)
Adequate blood pressure control.  No evidence of new end organ damage.  Tolerating medication without significant adverse effects.  Plan to continue current blood pressure regiment.   

## 2015-12-20 NOTE — Patient Instructions (Addendum)
Use a soap with Chlorhexadine at least once a week to keep the staphlococcus bacterial count knocked down which helps reduce itching of skin.  Moisturize with Shea butter (raw if available).  Moisturizing helps keep down itching and keeps eczema under control.   Try Metatarsal Pads (also called Metatarsal cookies) in both shoes to help decrease pain in your toes.     Eczema Eczema, also called atopic dermatitis, is a skin disorder that causes inflammation of the skin. It causes a red rash and dry, scaly skin. The skin becomes very itchy. Eczema is generally worse during the cooler winter months and often improves with the warmth of summer. Eczema usually starts showing signs in infancy. Some children outgrow eczema, but it may last through adulthood.  CAUSES  The exact cause of eczema is not known, but it appears to run in families. People with eczema often have a family history of eczema, allergies, asthma, or hay fever. Eczema is not contagious. Flare-ups of the condition may be caused by:   Contact with something you are sensitive or allergic to.   Stress. SIGNS AND SYMPTOMS  Dry, scaly skin.   Red, itchy rash.   Itchiness. This may occur before the skin rash and may be very intense.  DIAGNOSIS  The diagnosis of eczema is usually made based on symptoms and medical history. TREATMENT  Eczema cannot be cured, but symptoms usually can be controlled with treatment and other strategies. A treatment plan might include:  Controlling the itching and scratching.   Use over-the-counter antihistamines as directed for itching. This is especially useful at night when the itching tends to be worse.   Use over-the-counter steroid creams as directed for itching.   Avoid scratching. Scratching makes the rash and itching worse. It may also result in a skin infection (impetigo) due to a break in the skin caused by scratching.   Keeping the skin well moisturized with creams every day.  This will seal in moisture and help prevent dryness. Lotions that contain alcohol and water should be avoided because they can dry the skin.   Limiting exposure to things that you are sensitive or allergic to (allergens).   Recognizing situations that cause stress.   Developing a plan to manage stress.  HOME CARE INSTRUCTIONS   Only take over-the-counter or prescription medicines as directed by your health care provider.   Do not use anything on the skin without checking with your health care provider.   Keep baths or showers short (5 minutes) in warm (not hot) water. Use mild cleansers for bathing. These should be unscented. You may add nonperfumed bath oil to the bath water. It is best to avoid soap and bubble bath.   Immediately after a bath or shower, when the skin is still damp, apply a moisturizing ointment to the entire body. This ointment should be a petroleum ointment. This will seal in moisture and help prevent dryness. The thicker the ointment, the better. These should be unscented.   Keep fingernails cut short. Children with eczema may need to wear soft gloves or mittens at night after applying an ointment.   Dress in clothes made of cotton or cotton blends. Dress lightly, because heat increases itching.   A child with eczema should stay away from anyone with fever blisters or cold sores. The virus that causes fever blisters (herpes simplex) can cause a serious skin infection in children with eczema. SEEK MEDICAL CARE IF:   Your itching interferes with sleep.  Your rash gets worse or is not better within 1 week after starting treatment.   You see pus or soft yellow scabs in the rash area.   You have a fever.   You have a rash flare-up after contact with someone who has fever blisters.    This information is not intended to replace advice given to you by your health care provider. Make sure you discuss any questions you have with your health care  provider.   Document Released: 03/28/2000 Document Revised: 01/19/2013 Document Reviewed: 11/01/2012 Elsevier Interactive Patient Education [employer].

## 2015-12-21 LAB — COMPLETE METABOLIC PANEL WITH GFR
ALT: 40 U/L (ref 9–46)
AST: 22 U/L (ref 10–40)
Albumin: 4.2 g/dL (ref 3.6–5.1)
Alkaline Phosphatase: 93 U/L (ref 40–115)
BUN: 13 mg/dL (ref 7–25)
CHLORIDE: 102 mmol/L (ref 98–110)
CO2: 25 mmol/L (ref 20–31)
CREATININE: 1.03 mg/dL (ref 0.60–1.35)
Calcium: 9.4 mg/dL (ref 8.6–10.3)
GFR, Est Non African American: 87 mL/min (ref >=60)
Glucose, Bld: 80 mg/dL (ref 65–99)
POTASSIUM: 4 mmol/L (ref 3.5–5.3)
Sodium: 140 mmol/L (ref 135–146)
Total Bilirubin: 0.5 mg/dL (ref 0.2–1.2)
Total Protein: 7.4 g/dL (ref 6.1–8.1)

## 2015-12-21 LAB — LDL CHOLESTEROL, DIRECT: LDL DIRECT: 204 mg/dL — AB (ref <130)

## 2015-12-21 LAB — HIV ANTIBODY (ROUTINE TESTING W REFLEX): HIV: NONREACTIVE

## 2015-12-24 ENCOUNTER — Telehealth: Payer: Self-pay | Admitting: Family Medicine

## 2015-12-24 MED ORDER — ATORVASTATIN CALCIUM 40 MG PO TABS: 40.0000 mg | ORAL_TABLET | Freq: Every day | ORAL | 0 refills | Status: DC

## 2015-12-24 MED ORDER — ATORVASTATIN CALCIUM 40 MG PO TABS
40.0000 mg | ORAL_TABLET | Freq: Every day | ORAL | 3 refills | Status: DC
Start: 2015-12-24 — End: 2017-04-23

## 2015-12-24 NOTE — Telephone Encounter (Signed)
I spoke with Alexander Gentry about his cholesterol ldl elevation He reportsnot taking the pravastatin as he should. He agreed to switch to atorvastatin at high potency dosing for LDL > 190  Sening one month atorvastatin to his [facility] pharmacy and then 90 day supply with refills to his mail order pharmacy

## 2015-12-25 ENCOUNTER — Encounter: Payer: Self-pay | Admitting: Family Medicine

## 2015-12-27 ENCOUNTER — Encounter: Payer: Self-pay | Admitting: Family Medicine

## 2016-06-12 DIAGNOSIS — M25562 Pain in left knee: Secondary | ICD-10-CM | POA: Diagnosis not present

## 2016-10-06 ENCOUNTER — Other Ambulatory Visit: Payer: Self-pay | Admitting: Family Medicine

## 2016-12-31 ENCOUNTER — Other Ambulatory Visit: Payer: Self-pay | Admitting: Family Medicine

## 2017-01-02 ENCOUNTER — Other Ambulatory Visit: Payer: Self-pay | Admitting: Family Medicine

## 2017-02-21 ENCOUNTER — Other Ambulatory Visit: Payer: Self-pay | Admitting: Family Medicine

## 2017-03-12 ENCOUNTER — Other Ambulatory Visit: Payer: Self-pay | Admitting: Family Medicine

## 2017-04-23 ENCOUNTER — Ambulatory Visit (INDEPENDENT_AMBULATORY_CARE_PROVIDER_SITE_OTHER): Payer: BLUE CROSS/BLUE SHIELD | Admitting: Family Medicine

## 2017-04-23 ENCOUNTER — Encounter: Payer: Self-pay | Admitting: Family Medicine

## 2017-04-23 ENCOUNTER — Other Ambulatory Visit: Payer: Self-pay

## 2017-04-23 VITALS — BP 130/88 | HR 79 | Temp 98.5°F | Ht 67.0 in | Wt 225.0 lb

## 2017-04-23 DIAGNOSIS — G8929 Other chronic pain: Secondary | ICD-10-CM

## 2017-04-23 DIAGNOSIS — Z79899 Other long term (current) drug therapy: Secondary | ICD-10-CM | POA: Diagnosis not present

## 2017-04-23 DIAGNOSIS — M25561 Pain in right knee: Secondary | ICD-10-CM

## 2017-04-23 DIAGNOSIS — E785 Hyperlipidemia, unspecified: Secondary | ICD-10-CM | POA: Diagnosis not present

## 2017-04-23 DIAGNOSIS — Z23 Encounter for immunization: Secondary | ICD-10-CM | POA: Diagnosis not present

## 2017-04-23 DIAGNOSIS — E669 Obesity, unspecified: Secondary | ICD-10-CM

## 2017-04-23 DIAGNOSIS — I10 Essential (primary) hypertension: Secondary | ICD-10-CM | POA: Diagnosis not present

## 2017-04-23 DIAGNOSIS — Z6833 Body mass index (BMI) 33.0-33.9, adult: Secondary | ICD-10-CM | POA: Diagnosis not present

## 2017-04-23 DIAGNOSIS — Z125 Encounter for screening for malignant neoplasm of prostate: Secondary | ICD-10-CM | POA: Diagnosis not present

## 2017-04-23 DIAGNOSIS — M25562 Pain in left knee: Secondary | ICD-10-CM | POA: Diagnosis not present

## 2017-04-23 HISTORY — DX: Other chronic pain: G89.29

## 2017-04-23 MED ORDER — MELOXICAM 15 MG PO TABS: 15.0000 mg | ORAL_TABLET | Freq: Every day | ORAL | 2 refills | Status: DC

## 2017-04-23 NOTE — Assessment & Plan Note (Signed)
New problem No further workup at this time Likely degenerative joint pain of OA Recommend schedule APAP 650 TID with meloxicam Discussed weight loss, Ice / heat therapy, knee compression sleeve. Discussed options of Xray, PT, assist devices. steroid injection, Hyaluronic injection, Knee replacement,

## 2017-04-23 NOTE — Assessment & Plan Note (Addendum)
New problem No further workup at this time Likely degenerative joint pain of OA Recommend schedule APAP 650 TID with meloxicam Discussed weight loss, Ice / heat therapy, knee compression sleeve. Discussed options of Xray, PT, assist devices. steroid injection, Hyaluronic injection, Knee replacement,

## 2017-04-23 NOTE — Assessment & Plan Note (Signed)
Adequate blood pressure control.  No evidence of new end organ damage.  Tolerating medication without significant adverse effects.  Plan to continue current blood pressure regiment.  Check BMET for [facility]

## 2017-04-23 NOTE — Assessment & Plan Note (Signed)
Established problem worsened.  Discussed role of diet with exercise. Pt feels he has a good idea about both interventions and knows where he has been getting excess calories.  No FH DM Monitor

## 2017-04-23 NOTE — Patient Instructions (Signed)
I believe you have some early osteoarthritis in both your knees, probably a little worse in your left knee. Start taking Tylenol (acetaminophen) 650 mg three times a day regularly to keep a steady level of pain reduction medication in your system.  When the pain in your knees gets really worse, take the meloxicam tablet once a day to control the worsening pain.   Your Blood pressure looks good. Keep taking your hydrochlorothiazide blood pressure medication. It seems to be working.    Restart taking the atorvastatin for your high bad (LDL) cholesterol.    We are checking your electrolytes, kidneys, and blood sugar today.  Dr Tamber Burtch will call you if your tests are not good. Otherwise he will send you a letter.  If you sign up for MyChart online, you will be able to see your test results once Dr Anorah Trias has reviewed them.  If you do not hear from [place] with in 2 weeks please call our office  Your up 13 pounds from 2017.  You know what to do.     Osteoarthritis Osteoarthritis is a type of arthritis that affects tissue that covers the ends of bones in joints (cartilage). Cartilage acts as a cushion between the bones and helps them move smoothly. Osteoarthritis results when cartilage in the joints gets worn down. Osteoarthritis is sometimes called "wear and tear" arthritis. Osteoarthritis is the most common form of arthritis. It often occurs in older people. It is a condition that gets worse over time (a progressive condition). Joints that are most often affected by this condition are in: Fingers. Toes. Hips. Knees. Spine, including neck and lower back.  What are the causes? This condition is caused by age-related wearing down of cartilage that covers the ends of bones. What increases the risk? The following factors may make you more likely to develop this condition: Older age. Being overweight or obese. Overuse of joints, such as in athletes. Past injury of a joint. Past surgery on  a joint. Family history of osteoarthritis.  What are the signs or symptoms? The main symptoms of this condition are pain, swelling, and stiffness in the joint. The joint may lose its shape over time. Small pieces of bone or cartilage may break off and float inside of the joint, which may cause more pain and damage to the joint. Small deposits of bone (osteophytes) may grow on the edges of the joint. Other symptoms may include: A grating or scraping feeling inside the joint when you move it. Popping or creaking sounds when you move.  Symptoms may affect one or more joints. Osteoarthritis in a major joint, such as your knee or hip, can make it painful to walk or exercise. If you have osteoarthritis in your hands, you might not be able to grip items, twist your hand, or control small movements of your hands and fingers (fine motor skills). How is this diagnosed? This condition may be diagnosed based on: Your medical history. A physical exam. Your symptoms. X-rays of the affected joint(s). Blood tests to rule out other types of arthritis.  How is this treated? There is no cure for this condition, but treatment can help to control pain and improve joint function. Treatment plans may include: A prescribed exercise program that allows for rest and joint relief. You may work with a physical therapist. A weight control plan. Pain relief techniques, such as: Applying heat and cold to the joint. Electric pulses delivered to nerve endings under the skin (transcutaneous electrical nerve stimulation,  or TENS). Massage. Certain nutritional supplements. NSAIDs or prescription medicines to help relieve pain. Medicine to help relieve pain and inflammation (corticosteroids). This can be given by mouth (orally) or as an injection. Assistive devices, such as a brace, wrap, splint, specialized glove, or cane. Surgery, such as: An osteotomy. This is done to reposition the bones and relieve pain or to remove  loose pieces of bone and cartilage. Joint replacement surgery. You may need this surgery if you have very bad (advanced) osteoarthritis.  Follow these instructions at home: Activity Rest your affected joints as directed by your health care provider. Do not drive or use heavy machinery while taking prescription pain medicine. Exercise as directed. Your health care provider or physical therapist may recommend specific types of exercise, such as: Strengthening exercises. These are done to strengthen the muscles that support joints that are affected by arthritis. They can be performed with weights or with exercise bands to add resistance. Aerobic activities. These are exercises, such as brisk walking or water aerobics, that get your heart pumping. Range-of-motion activities. These keep your joints easy to move. Balance and agility exercises. Managing pain, stiffness, and swelling If directed, apply heat to the affected area as often as told by your health care provider. Use the heat source that your health care provider recommends, such as a moist heat pack or a heating pad. If you have a removable assistive device, remove it as told by your health care provider. Place a towel between your skin and the heat source. If your health care provider tells you to keep the assistive device on while you apply heat, place a towel between the assistive device and the heat source. Leave the heat on for 20-30 minutes. Remove the heat if your skin turns bright red. This is especially important if you are unable to feel pain, heat, or cold. You may have a greater risk of getting burned. If directed, put ice on the affected joint: If you have a removable assistive device, remove it as told by your health care provider. Put ice in a plastic bag. Place a towel between your skin and the bag. If your health care provider tells you to keep the assistive device on during icing, place a towel between the assistive device  and the bag. Leave the ice on for 20 minutes, 2-3 times a day. General instructions Take over-the-counter and prescription medicines only as told by your health care provider. Maintain a healthy weight. Follow instructions from your health care provider for weight control. These may include dietary restrictions. Do not use any products that contain nicotine or tobacco, such as cigarettes and e-cigarettes. These can delay bone healing. If you need help quitting, ask your health care provider. Use assistive devices as directed by your health care provider. Keep all follow-up visits as told by your health care provider. This is important. Where to find more information: [employer] of Arthritis and Musculoskeletal and Skin Diseases: www.niams.SouthExposed.es [employer] on Aging: http://kim-miller.com/ American College of Rheumatology: www.rheumatology.org Contact a health care provider if: Your skin turns red. You develop a rash. You have pain that gets worse. You have a fever along with joint or muscle aches. Get help right away if: You lose a lot of weight. You suddenly lose your appetite. You have night sweats. Summary Osteoarthritis is a type of arthritis that affects tissue covering the ends of bones in joints (cartilage). This condition is caused by age-related wearing down of cartilage that covers the ends of  bones. The main symptom of this condition is pain, swelling, and stiffness in the joint. There is no cure for this condition, but treatment can help to control pain and improve joint function. This information is not intended to replace advice given to you by your health care provider. Make sure you discuss any questions you have with your health care provider. Document Released: 03/31/2005 Document Revised: 12/03/2015 Document Reviewed: 12/03/2015 Elsevier Interactive Patient Education  2018 [employer].  Knee Pain, Adult Knee pain in adults is common. It can be caused by  many things, including:  Arthritis.  A fluid-filled sac (cyst) or growth in your knee.  An infection in your knee.  An injury that will not heal.  Damage, swelling, or irritation of the tissues that support your knee.  Knee pain is usually not a sign of a serious problem. The pain may go away on its own with time and rest. If it does not, a health care provider may order tests to find the cause of the pain. These may include:  Imaging tests, such as an X-ray, MRI, or ultrasound.  Joint aspiration. In this test, fluid is removed from the knee.  Arthroscopy. In this test, a lighted tube is inserted into knee and an image is projected onto a TV screen.  A biopsy. In this test, a sample of tissue is removed from the body and studied under a microscope.  Follow these instructions at home: Pay attention to any changes in your symptoms. Take these actions to relieve your pain. Activity  Rest your knee.  Do not do things that cause pain or make pain worse.  Avoid high-impact activities or exercises, such as running, jumping rope, or doing jumping jacks. General instructions  Take over-the-counter and prescription medicines only as told by your health care provider.  Raise (elevate) your knee above the level of your heart when you are sitting or lying down.  Sleep with a pillow under your knee.  If directed, apply ice to the knee: ? Put ice in a plastic bag. ? Place a towel between your skin and the bag. ? Leave the ice on for 20 minutes, 2-3 times a day.  Ask your health care provider if you should wear an elastic knee support.  Lose weight if you are overweight. Extra weight can put pressure on your knee.  Do not use any products that contain nicotine or tobacco, such as cigarettes and e-cigarettes. Smoking may slow the healing of any bone and joint problems that you may have. If you need help quitting, ask your health care provider. Contact a health care provider  if:  Your knee pain continues, changes, or gets worse.  You have a fever along with knee pain.  Your knee buckles or locks up.  Your knee swells, and the swelling becomes worse. Get help right away if:  Your knee feels warm to the touch.  You cannot move your knee.  You have severe pain in your knee.  You have chest pain.  You have trouble breathing. Summary  Knee pain in adults is common. It can be caused by many things, including, arthritis, infection, cysts, or injury.  Knee pain is usually not a sign of a serious problem, but if it does not go away, a health care provider may perform tests to know the cause of the pain.  Pay attention to any changes in your symptoms. Relieve your pain with rest, medicines, light activity, and use of ice.  Get  help if your pain continues or becomes very severe, or if your knee buckles or locks up, or if you have chest pain or trouble breathing. This information is not intended to replace advice given to you by your health care provider. Make sure you discuss any questions you have with your health care provider. Document Released: 01/26/2007 Document Revised: 03/21/2016 Document Reviewed: 03/21/2016 Elsevier Interactive Patient Education  [employer].

## 2017-04-23 NOTE — Assessment & Plan Note (Signed)
Established problem worsened.  Patient not taking atorvastatin bc ran out. Recommend restart atorvastatin 40 mg daily for patient with history LDL > 200

## 2017-04-23 NOTE — Progress Notes (Signed)
Alexander Gentry is alone Sources of clinical information for visit is/are patient and past medical records. Nursing assessment for this office visit was reviewed with the patient for accuracy and revision.  Previous Report(s) Reviewed: none  Depression screen PHQ 2/9 12/20/2015  Decreased Interest 0  Down, Depressed, Hopeless 0  PHQ - 2 Score 0   No flowsheet data found. HYPERTENSION Disease Monitoring: Blood pressure range- not checking at home Chest pain, palpitations- no      Dyspnea- no Medications: Compliance- yes Lightheadedness,Syncope- no   Edema- no   HYPERLIPIDEMIA Disease Monitoring: See symptoms for Hypertension Medications: Compliance- ran out of atorvastatin, did not understand if he was to take pravastatin and atorvastatin together. Right upper quadrant pain- no  Muscle aches- no    Knee osteoarthritis  for follow up on a knee problem involving Left > right. Onset was gradual, starting about 1 year ago. 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 Current symptoms include: pain located peripatella bilateral, occasional pain in posterior knee and stiffness. Pain is aggravated by going up and down stairs, inactivity, rising after sitting and standing. Patient has had prior knee problems. Evaluation to date: arthroscopy right knee ACL cadaveric repair and meniscus repair. Treatment to date: OTC analgesics which are not very effective.    Monitoring Labs and Parameters Last A1C: No results found for: HGBA1C  Last Lipid:     Component Value Date/Time   CHOL 201 (H) 02/15/2015 1210   HDL 54 02/15/2015 1210   LDLDIRECT 204 (H) 12/20/2015 1222    Last Bmet  Potassium  Date Value Ref Range Status  12/20/2015 4.0 3.5 - 5.3 mmol/L Final   Sodium  Date Value Ref Range Status  12/20/2015 140 135 - 146 mmol/L Final   Creat  Date Value Ref Range Status  12/20/2015 1.03 0.60 - 1.35 mg/dL Final     CrCl cannot be calculated  (Patient's most recent lab result is older than the maximum 21 days allowed.).  Last BPs:  BP Readings from Last 3 Encounters:  12/20/15 136/82  02/15/15 134/76  01/05/14 120/84    SH: Smoking status reviewed  ROS: See HPI  Vitals:   04/23/17 0908  BP: 130/88  Pulse: 79  Temp: 98.5 F (36.9 C)  SpO2: 97%   VS reviewed GEN: Alert, Cooperative, Groomed, NAD HEENT: PERRL; EAC bilaterally not occluded, TM's translucent with normal LM, (+) LR;                No cervical LAN, No thyromegaly, No palpable masses COR: RRR, No M/G/R, No JVD, Normal PMI size and location LUNGS: BCTA, No Acc mm use, speaking in full sentences EXT: No peripheral leg edema.Marland Kitchen   left Knee General Effusion: absent Erythema: absent Increased Warmth: absent  Tenderness: Lateral joint line  Maneuvers for ACL tear Anterior Drawer: absent  Maneuver for meniscus tear McMurray: absent Tender joint line: yes, lateral  no collateral ligament laxity and mild patella grind test   Range of Motion: not limited   right Knee General Effusion: absent Erythema: absent Increased Warmth: absent  Tenderness: None  Maneuvers for ACL tear Anterior Drawer: absent  Maneuver for meniscus tear Mc[place]ray: absent Tender joint line: no  no collateral ligament laxity, neg patella grind test and no PCL laxity   Range of Motion: not limited       Neuro: Oriented to person, place, and time  Gait: Normal speed, No significant path deviation, Step through +,  Psych:  Normal affect/thought/speech/language

## 2017-04-24 ENCOUNTER — Other Ambulatory Visit: Payer: Self-pay | Admitting: Family Medicine

## 2017-04-24 ENCOUNTER — Encounter: Payer: Self-pay | Admitting: Family Medicine

## 2017-04-24 DIAGNOSIS — E785 Hyperlipidemia, unspecified: Secondary | ICD-10-CM

## 2017-04-24 LAB — BASIC METABOLIC PANEL
BUN / CREAT RATIO: 15 (ref 9–20)
BUN: 14 mg/dL (ref 6–24)
CHLORIDE: 102 mmol/L (ref 96–106)
CO2: 26 mmol/L (ref 20–29)
Calcium: 9.3 mg/dL (ref 8.7–10.2)
Creatinine, Ser: 0.91 mg/dL (ref 0.76–1.27)
GFR calc non Af Amer: 100 mL/min/{1.73_m2} (ref >59)
GFR, EST AFRICAN AMERICAN: 116 mL/min/{1.73_m2} (ref >59)
GLUCOSE: 82 mg/dL (ref 65–99)
Potassium: 4.2 mmol/L (ref 3.5–5.2)
SODIUM: 140 mmol/L (ref 134–144)

## 2017-04-24 LAB — PSA: Prostate Specific Ag, Serum: 0.6 ng/mL (ref 0.0–4.0)

## 2017-04-27 ENCOUNTER — Other Ambulatory Visit: Payer: Self-pay | Admitting: *Deleted

## 2017-04-27 DIAGNOSIS — M25562 Pain in left knee: Principal | ICD-10-CM

## 2017-04-27 DIAGNOSIS — G8929 Other chronic pain: Secondary | ICD-10-CM

## 2017-04-27 DIAGNOSIS — M25561 Pain in right knee: Secondary | ICD-10-CM

## 2017-04-27 MED ORDER — MELOXICAM 15 MG PO TABS
15.0000 mg | ORAL_TABLET | Freq: Every day | ORAL | 2 refills | Status: DC
Start: 2017-04-27 — End: 2017-09-22

## 2017-04-27 MED ORDER — ATORVASTATIN CALCIUM 40 MG PO TABS: 40.0000 mg | ORAL_TABLET | Freq: Every day | ORAL | 3 refills | Status: DC

## 2017-09-22 ENCOUNTER — Encounter (HOSPITAL_COMMUNITY): Payer: Self-pay | Admitting: Emergency Medicine

## 2017-09-22 ENCOUNTER — Ambulatory Visit (HOSPITAL_COMMUNITY)
Admission: EM | Admit: 2017-09-22 | Discharge: 2017-09-22 | Disposition: A | Discharge status: Home / Self Care | Payer: BLUE CROSS/BLUE SHIELD | Attending: Family Medicine | Admitting: Family Medicine

## 2017-09-22 ENCOUNTER — Other Ambulatory Visit: Payer: Self-pay

## 2017-09-22 DIAGNOSIS — J0100 Acute maxillary sinusitis, unspecified: Secondary | ICD-10-CM

## 2017-09-22 MED ORDER — DOXYCYCLINE HYCLATE 100 MG PO CAPS: 100.0000 mg | ORAL_CAPSULE | Freq: Two times a day (BID) | ORAL | 0 refills | Status: DC

## 2017-09-22 MED ORDER — FLUTICASONE PROPIONATE 50 MCG/ACT NA SUSP: 2.0000 | Freq: Every day | NASAL | 0 refills | Status: DC

## 2017-09-22 NOTE — ED Triage Notes (Signed)
The patient presented to the [facility] with a complaint of sinus pain and pressure x 1 week.

## 2017-09-22 NOTE — ED Provider Notes (Signed)
[place]    CSN: [id number] Arrival date & time: 09/22/17  1944     History   Chief Complaint Chief Complaint  Patient presents with  . Facial Pain    HPI Alexander Gentry is a 47 y.o. male.   HPI  Patient has had symptoms for over a week.  He has sinus pressure and pain.  Purulent postnasal drainage.  Headache.  Sore throat.  Postnasal drip.  Cough.  Ear pressure and pain.  Feels malaise and tired.  No fever or chills.  No underlying allergies.  Non-smoker.  He states that he has had a sinus infection once before and this feels similar.  He has taken over-the-counter cold medicines.  This is not helped.  No chest pain, shortness of breath, wheezing, underlying asthma or COPD.  Past Medical History:  Diagnosis Date  . Chronic pain of left knee 04/23/2017  . Chronic pain of right knee 11/17/2013   S/P ACL and meniscectomy on right knee in 2014.   Marland Kitchen Elevated transaminase level 12/07/2012  . HYPERLIPIDEMIA 05/13/2007  . Hypertension 11/2010  . OBESITY 05/13/2007  . ROTATOR CUFF INJURY, RIGHT SHOULDER 05/24/2009    Patient Active Problem List   Diagnosis Date Noted  . Chronic pain of left knee 04/23/2017  . Chronic pain of right knee 11/17/2013  . Hypertension goal BP (blood pressure) < 140/90 06/22/2008    Class: Chronic  . Hyperlipidemia LDL goal <130 05/13/2007  . OBESITY 05/13/2007    Past Surgical History:  Procedure Laterality Date  . KNEE ARTHROSCOPY WITH ANTERIOR CRUCIATE LIGAMENT (ACL) REPAIR Right 2014   Dr Ivin Booty Manson Passey, [facility]) Cadaveric ACL transplant  . MENISCUS REPAIR Right 2014   Dr Ivin Booty Manson Passey, [facility])       Home Medications    Prior to Admission medications   Medication Sig Start Date End Date Taking? Authorizing Provider  aspirin EC 81 MG tablet Take 1 tablet (81 mg total) by mouth daily. 04/22/12  Yes McDiarmid, Blane Ohara, MD  atorvastatin (LIPITOR) 40 MG tablet Take 1 tablet (40 mg total) by mouth daily. 04/27/17  Yes  McDiarmid, Blane Ohara, MD  hydrochlorothiazide (HYDRODIURIL) 25 MG tablet TAKE ONE-HALF TABLET BY MOUTH ONCE DAILY 10/06/16  Yes McDiarmid, Blane Ohara, MD  doxycycline (VIBRAMYCIN) 100 MG capsule Take 1 capsule (100 mg total) by mouth 2 (two) times daily. 09/22/17   Raylene Everts, MD  fluticasone [facility]) 50 MCG/ACT nasal spray Place 2 sprays into both nostrils daily. 09/22/17   Raylene Everts, MD    Family History Family History  Problem Relation Age of Onset  . Diabetes type II Maternal Uncle   . Hypertension Mother     Social History Social History   Tobacco Use  . Smoking status: Never Smoker  . Smokeless tobacco: Never Used  Substance Use Topics  . Alcohol use: Yes    Alcohol/week: 0.5 oz    Types: 1 drink(s) per week  . Drug use: No     Allergies   Penicillins   Review of Systems Review of Systems  Constitutional: Positive for activity change, appetite change and fatigue. Negative for chills and fever.  HENT: Positive for congestion, ear pain, postnasal drip, rhinorrhea, sinus pressure, sinus pain and sore throat.   Eyes: Negative for photophobia, pain and visual disturbance.  Respiratory: Positive for cough. Negative for choking and shortness of breath.   Cardiovascular: Negative for chest pain and palpitations.  Gastrointestinal: Negative for abdominal pain, nausea and vomiting.  Genitourinary: Negative for dysuria and hematuria.  Musculoskeletal: Negative for arthralgias and back pain.  Skin: Negative for color change and rash.  Neurological: Positive for headaches. Negative for seizures and syncope.  All other systems reviewed and are negative.    Physical Exam Triage Vital Signs ED Triage Vitals [09/22/17 2031]  Enc Vitals Group     BP (!) 167/102     Pulse Rate 69     Resp 18     Temp 98.5 F (36.9 C)     Temp Source Oral     SpO2 100 %     Weight      Height      Head Circumference      Peak Flow      Pain Score 6     Pain Loc      Pain Edu?       Excl. in [place]?    No data found.  Updated Vital Signs BP (!) 167/102 (BP Location: Left Arm)   Pulse 69   Temp 98.5 F (36.9 C) (Oral)   Resp 18   SpO2 100%   Visual Acuity Right Eye Distance:   Left Eye Distance:   Bilateral Distance:    Right Eye Near:   Left Eye Near:    Bilateral Near:     Physical Exam  Constitutional: He appears well-developed and well-nourished. No distress.  HENT:  Head: Normocephalic and atraumatic.  Right Ear: External ear normal.  Left Ear: External ear normal.  Mouth/Throat: Oropharynx is clear and moist.  Nasal membranes swollen and red.  Sinuses tender to palpation.  Eyes: Pupils are equal, round, and reactive to light. Conjunctivae and EOM are normal.  Neck: Normal range of motion.  Cardiovascular: Normal rate, regular rhythm and normal heart sounds.  Pulmonary/Chest: Effort normal and breath sounds normal. No respiratory distress. He has no wheezes.  Abdominal: Soft. He exhibits no distension.  Musculoskeletal: Normal range of motion. He exhibits no edema.  Lymphadenopathy:    He has cervical adenopathy.  Neurological: He is alert.  Skin: Skin is warm and dry.  Psychiatric: He has a normal mood and affect.     UC Treatments / Results  Labs (all labs ordered are listed, but only abnormal results are displayed) Labs Reviewed - No data to display  EKG None  Radiology No results found.  Procedures Procedures (including critical care time)  Medications Ordered in UC Medications - No data to display  Initial Impression / Assessment and Plan / UC Course  I have reviewed the triage vital signs and the nursing notes.  Pertinent labs & imaging results that were available during my care of the patient were reviewed by me and considered in my medical decision making (see chart for details).     Sinusitis.  Discussed that these are often caused by viruses and will clear on their own.  Because he has had symptoms for over a  week, worsening symptoms, and purulent drainage I will offer him antibiotics. Final Clinical Impressions(s) / UC Diagnoses   Final diagnoses:  Acute non-recurrent maxillary sinusitis     Discharge Instructions     Drink plenty of water Take the antibiotic 2 x a day Use the flonase daily Off work tomorrow   ED Prescriptions    Medication Sig Dispense Auth. Provider   fluticasone (FLONASE) 50 MCG/ACT nasal spray Place 2 sprays into both nostrils daily. 16 g Raylene Everts, MD   doxycycline (VIBRAMYCIN) 100 MG capsule Take  1 capsule (100 mg total) by mouth 2 (two) times daily. 20 capsule Raylene Everts, MD     Controlled Substance Prescriptions [place] Controlled Substance Registry consulted? Not Applicable   Raylene Everts, MD 09/22/17 2133

## 2017-09-22 NOTE — Discharge Instructions (Addendum)
Drink plenty of water Take the antibiotic 2 x a day Use the flonase daily Off work tomorrow

## 2018-08-10 ENCOUNTER — Encounter: Payer: Self-pay | Admitting: Family Medicine

## 2018-08-10 DIAGNOSIS — E785 Hyperlipidemia, unspecified: Secondary | ICD-10-CM

## 2018-08-11 NOTE — Telephone Encounter (Signed)
Will forward to MD to refill medications.  Juron Vorhees,CMA

## 2018-08-13 MED ORDER — FLUTICASONE PROPIONATE 50 MCG/ACT NA SUSP: 2.0000 | Freq: Every day | NASAL | 1 refills | Status: DC

## 2018-08-13 MED ORDER — ATORVASTATIN CALCIUM 40 MG PO TABS: 40.0000 mg | ORAL_TABLET | Freq: Every day | ORAL | 0 refills | Status: DC

## 2018-08-13 MED ORDER — HYDROCHLOROTHIAZIDE 25 MG PO TABS: 12.5000 mg | ORAL_TABLET | Freq: Every day | ORAL | 1 refills | Status: DC

## 2018-09-06 ENCOUNTER — Other Ambulatory Visit: Payer: Self-pay | Admitting: Family Medicine

## 2018-11-05 ENCOUNTER — Other Ambulatory Visit: Payer: Self-pay | Admitting: Family Medicine

## 2018-11-05 DIAGNOSIS — E785 Hyperlipidemia, unspecified: Secondary | ICD-10-CM

## 2019-04-24 ENCOUNTER — Inpatient Hospital Stay (HOSPITAL_COMMUNITY)
Admission: EM | Admit: 2019-04-24 | Discharge: 2019-04-27 | DRG: 392 | Disposition: A | Discharge status: Home / Self Care | Payer: BC Managed Care – PPO | Attending: Physician Assistant | Admitting: Physician Assistant

## 2019-04-24 ENCOUNTER — Encounter (HOSPITAL_COMMUNITY): Payer: Self-pay | Admitting: *Deleted

## 2019-04-24 ENCOUNTER — Ambulatory Visit (INDEPENDENT_AMBULATORY_CARE_PROVIDER_SITE_OTHER)
Admission: EM | Admit: 2019-04-24 | Discharge: 2019-04-24 | Disposition: A | Discharge status: Nursing Home (Includes ICF) | Payer: BC Managed Care – PPO | Source: Home / Self Care | Attending: Family Medicine | Admitting: Family Medicine

## 2019-04-24 ENCOUNTER — Other Ambulatory Visit: Payer: Self-pay

## 2019-04-24 ENCOUNTER — Emergency Department (HOSPITAL_COMMUNITY): Payer: BC Managed Care – PPO

## 2019-04-24 ENCOUNTER — Encounter (HOSPITAL_COMMUNITY): Payer: Self-pay

## 2019-04-24 DIAGNOSIS — K5900 Constipation, unspecified: Secondary | ICD-10-CM | POA: Diagnosis not present

## 2019-04-24 DIAGNOSIS — Z20822 Contact with and (suspected) exposure to covid-19: Secondary | ICD-10-CM | POA: Diagnosis not present

## 2019-04-24 DIAGNOSIS — Z833 Family history of diabetes mellitus: Secondary | ICD-10-CM

## 2019-04-24 DIAGNOSIS — Z8249 Family history of ischemic heart disease and other diseases of the circulatory system: Secondary | ICD-10-CM

## 2019-04-24 DIAGNOSIS — Z880 Allergy status to penicillin: Secondary | ICD-10-CM

## 2019-04-24 DIAGNOSIS — Z7982 Long term (current) use of aspirin: Secondary | ICD-10-CM

## 2019-04-24 DIAGNOSIS — E669 Obesity, unspecified: Secondary | ICD-10-CM | POA: Diagnosis not present

## 2019-04-24 DIAGNOSIS — R1030 Lower abdominal pain, unspecified: Secondary | ICD-10-CM | POA: Diagnosis not present

## 2019-04-24 DIAGNOSIS — E785 Hyperlipidemia, unspecified: Secondary | ICD-10-CM | POA: Diagnosis present

## 2019-04-24 DIAGNOSIS — R1031 Right lower quadrant pain: Secondary | ICD-10-CM | POA: Diagnosis not present

## 2019-04-24 DIAGNOSIS — I10 Essential (primary) hypertension: Secondary | ICD-10-CM | POA: Diagnosis not present

## 2019-04-24 DIAGNOSIS — K5792 Diverticulitis of intestine, part unspecified, without perforation or abscess without bleeding: Secondary | ICD-10-CM | POA: Diagnosis not present

## 2019-04-24 DIAGNOSIS — G8929 Other chronic pain: Secondary | ICD-10-CM | POA: Diagnosis not present

## 2019-04-24 DIAGNOSIS — K5720 Diverticulitis of large intestine with perforation and abscess without bleeding: Secondary | ICD-10-CM | POA: Diagnosis not present

## 2019-04-24 DIAGNOSIS — Z6834 Body mass index (BMI) 34.0-34.9, adult: Secondary | ICD-10-CM

## 2019-04-24 DIAGNOSIS — Z79899 Other long term (current) drug therapy: Secondary | ICD-10-CM

## 2019-04-24 DIAGNOSIS — Z03818 Encounter for observation for suspected exposure to other biological agents ruled out: Secondary | ICD-10-CM | POA: Diagnosis not present

## 2019-04-24 HISTORY — DX: Diverticulitis of large intestine with perforation and abscess without bleeding: K57.20

## 2019-04-24 LAB — COMPREHENSIVE METABOLIC PANEL
ALT: 31 U/L (ref 0–44)
AST: 17 U/L (ref 15–41)
Albumin: 3.8 g/dL (ref 3.5–5.0)
Alkaline Phosphatase: 93 U/L (ref 38–126)
Anion gap: 9 (ref 5–15)
BUN: 13 mg/dL (ref 6–20)
CO2: 25 mmol/L (ref 22–32)
Calcium: 9 mg/dL (ref 8.9–10.3)
Chloride: 103 mmol/L (ref 98–111)
Creatinine, Ser: 1.2 mg/dL (ref 0.61–1.24)
GFR calc Af Amer: >60 mL/min (ref >60)
GFR calc non Af Amer: >60 mL/min (ref >60)
Glucose, Bld: 87 mg/dL (ref 70–99)
Potassium: 4 mmol/L (ref 3.5–5.1)
Sodium: 137 mmol/L (ref 135–145)
Total Bilirubin: 1.4 mg/dL — ABNORMAL HIGH (ref 0.3–1.2)
Total Protein: 7.8 g/dL (ref 6.5–8.1)

## 2019-04-24 LAB — CBC
HCT: 42.7 % (ref 39.0–52.0)
Hemoglobin: 14.6 g/dL (ref 13.0–17.0)
MCH: 30.1 pg (ref 26.0–34.0)
MCHC: 34.2 g/dL (ref 30.0–36.0)
MCV: 88 fL (ref 80.0–100.0)
Platelets: 243 10*3/uL (ref 150–400)
RBC: 4.85 MIL/uL (ref 4.22–5.81)
RDW: 13.5 % (ref 11.5–15.5)
WBC: 16.7 10*3/uL — ABNORMAL HIGH (ref 4.0–10.5)
nRBC: 0 % (ref 0.0–0.2)

## 2019-04-24 LAB — URINALYSIS, ROUTINE W REFLEX MICROSCOPIC
Bilirubin Urine: NEGATIVE
Glucose, UA: NEGATIVE mg/dL
Hgb urine dipstick: NEGATIVE
Ketones, ur: 20 mg/dL — AB
Leukocytes,Ua: NEGATIVE
Nitrite: NEGATIVE
Protein, ur: NEGATIVE mg/dL
Specific Gravity, Urine: >1.046 — ABNORMAL HIGH (ref 1.005–1.030)
pH: 5 (ref 5.0–8.0)

## 2019-04-24 LAB — RESPIRATORY PANEL BY RT PCR (FLU A&B, COVID)
Influenza A by PCR: NEGATIVE
Influenza B by PCR: NEGATIVE
SARS Coronavirus 2 by RT PCR: NEGATIVE

## 2019-04-24 LAB — LIPASE, BLOOD: Lipase: 22 U/L (ref 11–51)

## 2019-04-24 MED ORDER — ACETAMINOPHEN 325 MG PO TABS
650.0000 mg | ORAL_TABLET | Freq: Four times a day (QID) | ORAL | Status: DC | PRN
  Administered 2019-04-25 – 2019-04-26 (×2): 650 mg via ORAL
  Filled 2019-04-24 (×2): qty 2

## 2019-04-24 MED ORDER — DIPHENHYDRAMINE HCL 50 MG/ML IJ SOLN: 25.0000 mg | Freq: Four times a day (QID) | INTRAMUSCULAR | Status: DC | PRN

## 2019-04-24 MED ORDER — ONDANSETRON HCL 4 MG/2ML IJ SOLN
4.0000 mg | Freq: Four times a day (QID) | INTRAMUSCULAR | Status: DC | PRN
  Administered 2019-04-26 – 2019-04-27 (×2): 4 mg via INTRAVENOUS
  Filled 2019-04-24 (×3): qty 2

## 2019-04-24 MED ORDER — HYDROCHLOROTHIAZIDE 25 MG PO TABS
12.5000 mg | ORAL_TABLET | Freq: Every day | ORAL | Status: DC
  Filled 2019-04-24: qty 1

## 2019-04-24 MED ORDER — SODIUM CHLORIDE 0.9% FLUSH
3.0000 mL | Freq: Once | INTRAVENOUS | Status: AC
  Administered 2019-04-24: 18:00:00 3 mL via INTRAVENOUS

## 2019-04-24 MED ORDER — ACETAMINOPHEN 650 MG RE SUPP: 650.0000 mg | Freq: Four times a day (QID) | RECTAL | Status: DC | PRN

## 2019-04-24 MED ORDER — SIMETHICONE 80 MG PO CHEW
40.0000 mg | CHEWABLE_TABLET | Freq: Four times a day (QID) | ORAL | Status: DC | PRN
  Filled 2019-04-24: qty 1

## 2019-04-24 MED ORDER — ONDANSETRON 4 MG PO TBDP: 4.0000 mg | ORAL_TABLET | Freq: Four times a day (QID) | ORAL | Status: DC | PRN

## 2019-04-24 MED ORDER — SODIUM CHLORIDE 0.9 % IV SOLN
2.0000 g | Freq: Three times a day (TID) | INTRAVENOUS | Status: DC
  Administered 2019-04-24 – 2019-04-27 (×8): 2 g via INTRAVENOUS
  Filled 2019-04-24 (×10): qty 2

## 2019-04-24 MED ORDER — METRONIDAZOLE IN NACL 5-0.79 MG/ML-% IV SOLN
500.0000 mg | Freq: Three times a day (TID) | INTRAVENOUS | Status: DC
  Administered 2019-04-24 – 2019-04-27 (×8): 500 mg via INTRAVENOUS
  Filled 2019-04-24 (×8): qty 100

## 2019-04-24 MED ORDER — HYDROMORPHONE HCL 1 MG/ML IJ SOLN
0.5000 mg | Freq: Once | INTRAMUSCULAR | Status: AC
  Administered 2019-04-24: 18:00:00 0.5 mg via INTRAVENOUS
  Filled 2019-04-24: qty 1

## 2019-04-24 MED ORDER — ATORVASTATIN CALCIUM 40 MG PO TABS
40.0000 mg | ORAL_TABLET | Freq: Every day | ORAL | Status: DC
  Administered 2019-04-24 – 2019-04-27 (×4): 40 mg via ORAL
  Filled 2019-04-24 (×4): qty 1

## 2019-04-24 MED ORDER — POTASSIUM CHLORIDE IN NACL 20-0.9 MEQ/L-% IV SOLN
INTRAVENOUS | Status: DC
  Administered 2019-04-24: 22:00:00 1000 mL via INTRAVENOUS
  Filled 2019-04-24 (×5): qty 1000

## 2019-04-24 MED ORDER — DOCUSATE SODIUM 100 MG PO CAPS
100.0000 mg | ORAL_CAPSULE | Freq: Two times a day (BID) | ORAL | Status: DC
  Administered 2019-04-24 – 2019-04-27 (×6): 100 mg via ORAL
  Filled 2019-04-24 (×6): qty 1

## 2019-04-24 MED ORDER — IOHEXOL 300 MG/ML  SOLN
100.0000 mL | Freq: Once | INTRAMUSCULAR | Status: AC | PRN
  Administered 2019-04-24: 19:00:00 100 mL via INTRAVENOUS

## 2019-04-24 MED ORDER — HYDROMORPHONE HCL 1 MG/ML IJ SOLN
1.0000 mg | INTRAMUSCULAR | Status: DC | PRN
  Administered 2019-04-24 – 2019-04-27 (×6): 1 mg via INTRAVENOUS
  Filled 2019-04-24 (×6): qty 1

## 2019-04-24 MED ORDER — ENOXAPARIN SODIUM 40 MG/0.4ML ~~LOC~~ SOLN
40.0000 mg | SUBCUTANEOUS | Status: DC
  Administered 2019-04-24 – 2019-04-26 (×3): 40 mg via SUBCUTANEOUS
  Filled 2019-04-24 (×3): qty 0.4

## 2019-04-24 MED ORDER — DIPHENHYDRAMINE HCL 25 MG PO CAPS: 25.0000 mg | ORAL_CAPSULE | Freq: Four times a day (QID) | ORAL | Status: DC | PRN

## 2019-04-24 MED ORDER — SODIUM CHLORIDE 0.9 % IV BOLUS
1000.0000 mL | Freq: Once | INTRAVENOUS | Status: AC
  Administered 2019-04-24: 18:00:00 1000 mL via INTRAVENOUS

## 2019-04-24 NOTE — ED Triage Notes (Signed)
abd pain since Friday no n v or diarrhea.  He was sent here [facility] no labd work  Engineer, manufacturing systems here for treatment

## 2019-04-24 NOTE — Discharge Instructions (Addendum)
To ER for possible CT or ultrasound to rule out appendicitis or diverticular disease

## 2019-04-24 NOTE — ED Notes (Signed)
Returned from [place],

## 2019-04-24 NOTE — ED Notes (Signed)
ED TO INPATIENT HANDOFF REPORT  ED Nurse Name and Phone #:   S Name/Age/Gender Alexander Gentry 49 y.o. male Room/Bed: 019C/019C  Code Status   Code Status: Full Code  Home/SNF/Other Home Patient oriented to: self, place, time and situation Is this baseline? No   Triage Complete: Triage complete  Chief Complaint Perforation of sigmoid colon due to diverticulitis [K57.20]  Triage Note abd pain since Friday no n v or diarrhea.  [facility] work  Engineer, manufacturing systems here for treatment    Allergies Allergies  Allergen Reactions  . Penicillins     Level of Care/Admitting Diagnosis ED Disposition    ED Disposition Condition Comment   Admit  Hospital Area: [place] [[id number]]  Level of Care: Med-Surg [16]  Covid Evaluation: Asymptomatic Screening Protocol (No Symptoms)  Diagnosis: Perforation of sigmoid colon due to diverticulitis [id number]  Admitting Physician: CCS, [place]  Attending Physician: CCS, MD [[id number]]  Estimated length of stay: past midnight tomorrow  Certification:: I certify this patient will need inpatient services for at least 2 midnights  Bed request comments: 6N       B Medical/Surgery History Past Medical History:  Diagnosis Date  . Chronic pain of left knee 04/23/2017  . Chronic pain of right knee 11/17/2013   S/P ACL and meniscectomy on right knee in 2014.   Marland Kitchen Elevated transaminase level 12/07/2012  . HYPERLIPIDEMIA 05/13/2007  . Hypertension 11/2010  . OBESITY 05/13/2007  . ROTATOR CUFF INJURY, RIGHT SHOULDER 05/24/2009   Past Surgical History:  Procedure Laterality Date  . KNEE ARTHROSCOPY WITH ANTERIOR CRUCIATE LIGAMENT (ACL) REPAIR Right 2014   Dr Ivin Booty Manson Passey, [facility]) Cadaveric ACL transplant  . MENISCUS REPAIR Right 2014   Dr Ivin Booty Manson Passey, [facility])     A IV Location/Drains/Wounds Patient Lines/Drains/Airways Status   Active Line/Drains/Airways    Name:   Placement date:   Placement time:    Site:   Days:   Peripheral IV 04/24/19 Right Antecubital   04/24/19    --    Antecubital   less than 1          Intake/Output Last 24 hours No intake or output data in the 24 hours ending 04/24/19 2014  Labs/Imaging Results for orders placed or performed during the hospital encounter of 04/24/19 (from the past 48 hour(s))  Lipase, blood     Status: None   Collection Time: 04/24/19  4:40 PM  Result Value Ref Range   Lipase 22 11 - 51 U/L    Comment: Performed at [facility] [address], [place], [place] [address]  Comprehensive metabolic panel     Status: Abnormal   Collection Time: 04/24/19  4:40 PM  Result Value Ref Range   Sodium 137 135 - 145 mmol/L   Potassium 4.0 3.5 - 5.1 mmol/L   Chloride 103 98 - 111 mmol/L   CO2 25 22 - 32 mmol/L   Glucose, Bld 87 70 - 99 mg/dL   BUN 13 6 - 20 mg/dL   Creatinine, Ser 1.20 0.61 - 1.24 mg/dL   Calcium 9.0 8.9 - 10.3 mg/dL   Total Protein 7.8 6.5 - 8.1 g/dL   Albumin 3.8 3.5 - 5.0 g/dL   AST 17 15 - 41 U/L   ALT 31 0 - 44 U/L   Alkaline Phosphatase 93 38 - 126 U/L   Total Bilirubin 1.4 (H) 0.3 - 1.2 mg/dL   GFR calc non Af Amer >  60 >60 mL/min   GFR calc Af Amer >60 >60 mL/min   Anion gap 9 5 - 15    Comment: Performed at [facility] [address] [place], [place] [address]  CBC     Status: Abnormal   Collection Time: 04/24/19  4:40 PM  Result Value Ref Range   WBC 16.7 (H) 4.0 - 10.5 K/uL   RBC 4.85 4.22 - 5.81 MIL/uL   Hemoglobin 14.6 13.0 - 17.0 g/dL   HCT 42.7 39.0 - 52.0 %   MCV 88.0 80.0 - 100.0 fL   MCH 30.1 26.0 - 34.0 pg   MCHC 34.2 30.0 - 36.0 g/dL   RDW 13.5 11.5 - 15.5 %   Platelets 243 150 - 400 K/uL   nRBC 0.0 0.0 - 0.2 %    Comment: Performed at [facility], [place] [address], [place], [place] [address]   CT Abdomen Pelvis W Contrast  Result Date: 04/24/2019 CLINICAL DATA:  Right lower quadrant pain EXAM: CT ABDOMEN AND PELVIS WITH CONTRAST TECHNIQUE: Multidetector CT imaging of the  abdomen and pelvis was performed using the standard protocol following bolus administration of intravenous contrast. CONTRAST:  145mL OMNIPAQUE IOHEXOL 300 MG/ML  SOLN COMPARISON:  None. FINDINGS: Lower chest: Lung bases are clear. No effusions. Heart is normal size. Hepatobiliary: No focal hepatic abnormality. Gallbladder unremarkable. Pancreas: No focal abnormality or ductal dilatation. Spleen: No focal abnormality.  Normal size. Adrenals/Urinary Tract: Small bilateral renal cysts. No renal or ureteral stones. No hydronephrosis. Adrenal glands and urinary bladder unremarkable. Stomach/Bowel: Sigmoid diverticulosis. Inflammatory stranding around the sigmoid colon compatible with active diverticulitis. There is a small extraluminal gas collection adjacent to the sigmoid colon compatible with micro perforation. Vascular/Lymphatic: No evidence of aneurysm or adenopathy. Reproductive: No visible focal abnormality. Other: No free fluid or free air. Musculoskeletal: No acute bony abnormality. IMPRESSION: Sigmoid diverticulosis with associated acute diverticulitis changes. Small extraluminal gas collection adjacent to the sigmoid colon compatible with microperforation. Electronically Signed   By: Rolm Baptise M.D.   On: 04/24/2019 18:54    Pending Labs Unresulted Labs (From admission, onward)    Start     Ordered   04/25/19 [id number]  Basic metabolic panel  Tomorrow morning,   R     04/24/19 2013   04/25/19 0500  CBC  Tomorrow morning,   R     04/24/19 2013   04/24/19 2013  HIV Antibody (routine testing w rflx)  (HIV Antibody (Routine testing w reflex) panel)  Once,   STAT     04/24/19 2013   04/24/19 1929  Respiratory Panel by RT PCR (Flu A&B, Covid) - Nasopharyngeal Swab  (Tier 2 Respiratory Panel by RT PCR (Flu A&B, Covid) (TAT 2 hrs))  Once,   STAT    Question Answer Comment  Is this test for diagnosis or screening Screening   Symptomatic for COVID-19 as defined by CDC No   Hospitalized for COVID-19 No    Admitted to ICU for COVID-19 No   Previously tested for COVID-19 No   Resident in a congregate (group) care setting Unknown   Employed in healthcare setting Unknown      04/24/19 1928   04/24/19 1632  Urinalysis, Routine w reflex microscopic  ONCE - STAT,   STAT     04/24/19 1631          Vitals/Pain Today's Vitals   04/24/19 1854 04/24/19 1900 04/24/19 1915 04/24/19 1930  BP:  136/78 136/78 (!) 122/59  Pulse:  88 90 86  Resp:  (!) 22 20 (!) 22  Temp:      TempSrc:      SpO2:  99% 96% 98%  Weight:      Height:      PainSc: 6        Isolation Precautions No active isolations  Medications Medications  hydrochlorothiazide (HYDRODIURIL) tablet 12.5 mg (has no administration in time range)  atorvastatin (LIPITOR) tablet 40 mg (has no administration in time range)  enoxaparin (LOVENOX) injection 40 mg (has no administration in time range)  0.9 % NaCl with KCl 20 mEq/ L  infusion (has no administration in time range)  ceFEPIme (MAXIPIME) 2 g in sodium chloride 0.9 % 100 mL IVPB (has no administration in time range)    And  metroNIDAZOLE (FLAGYL) IVPB 500 mg (has no administration in time range)  acetaminophen (TYLENOL) tablet 650 mg (has no administration in time range)    Or  acetaminophen (TYLENOL) suppository 650 mg (has no administration in time range)  HYDROmorphone (DILAUDID) injection 1 mg (has no administration in time range)  diphenhydrAMINE (BENADRYL) capsule 25 mg (has no administration in time range)    Or  diphenhydrAMINE (BENADRYL) injection 25 mg (has no administration in time range)  ondansetron (ZOFRAN-ODT) disintegrating tablet 4 mg (has no administration in time range)    Or  ondansetron (ZOFRAN) injection 4 mg (has no administration in time range)  docusate sodium (COLACE) capsule 100 mg (has no administration in time range)  simethicone (MYLICON) chewable tablet 40 mg (has no administration in time range)  sodium chloride flush (NS) 0.9 % injection 3  mL (3 mLs Intravenous Given 04/24/19 1802)  HYDROmorphone (DILAUDID) injection 0.5 mg (0.5 mg Intravenous Given 04/24/19 1802)  sodium chloride 0.9 % bolus 1,000 mL (1,000 mLs Intravenous New Bag/Given 04/24/19 1801)  iohexol (OMNIPAQUE) 300 MG/ML solution 100 mL (100 mLs Intravenous Contrast Given 04/24/19 1835)    Mobility walks Low fall risk   Focused Assessments Pulmonary Assessment Handoff:  Lung sounds:   O2 Device: Room Air     ,    R Recommendations: See Admitting Provider Note  Report given to:   Additional Notes:

## 2019-04-24 NOTE — H&P (Signed)
Alexander Gentry is an 49 y.o. male.   Chief Complaint: Lower abdominal pain HPI: This is a 49 year old male who presents with a three-day history of slowly worsening lower abdominal pain.  No fever.  No nausea or vomiting.  Had a BM yesterday that was relatively normal.  He was evaluated by the ED and found to have diverticulitis with an apparent microperforation behind his bladder.  We are asked to admit the patient for treatment.  No previous episodes of diverticulitis. No previous colonoscopy No abdominal surgery  Past Medical History:  Diagnosis Date  . Chronic pain of left knee 04/23/2017  . Chronic pain of right knee 11/17/2013   S/P ACL and meniscectomy on right knee in 2014.   Marland Kitchen Elevated transaminase level 12/07/2012  . HYPERLIPIDEMIA 05/13/2007  . Hypertension 11/2010  . OBESITY 05/13/2007  . ROTATOR CUFF INJURY, RIGHT SHOULDER 05/24/2009    Past Surgical History:  Procedure Laterality Date  . KNEE ARTHROSCOPY WITH ANTERIOR CRUCIATE LIGAMENT (ACL) REPAIR Right 2014   Dr Ivin Booty Manson Passey, [facility]) Cadaveric ACL transplant  . MENISCUS REPAIR Right 2014   Dr Ivin Booty Manson [facility], [facility])    Family History  Problem Relation Age of Onset  . Diabetes type II Maternal Uncle   . Hypertension Mother    Social History:  reports that he has never smoked. He has never used smokeless tobacco. He reports current alcohol use of about 1.0 standard drinks of alcohol per week. He reports that he does not use drugs.  Allergies:  Allergies  Allergen Reactions  . Penicillins     Prior to Admission medications   Medication Sig Start Date End Date Taking? Authorizing Provider  aspirin EC 81 MG tablet Take 1 tablet (81 mg total) by mouth daily. 04/22/12   McDiarmid, Blane Ohara, MD  atorvastatin (LIPITOR) 40 MG tablet TAKE 1 TABLET BY MOUTH EVERY DAY 11/08/18   McDiarmid, Blane Ohara, MD  doxycycline (VIBRAMYCIN) 100 MG capsule Take 1 capsule (100 mg total) by mouth 2 (two) times daily. 09/22/17    Raylene Everts, MD  fluticasone [facility]) 50 MCG/ACT nasal spray SPRAY 2 SPRAYS INTO EACH NOSTRIL EVERY DAY 09/07/18   McDiarmid, Blane Ohara, MD  hydrochlorothiazide (HYDRODIURIL) 25 MG tablet Take 0.5 tablets (12.5 mg total) by mouth daily. 08/13/18   McDiarmid, Blane Ohara, MD     Results for orders placed or performed during the hospital encounter of 04/24/19 (from the past 48 hour(s))  Lipase, blood     Status: None   Collection Time: 04/24/19  4:40 PM  Result Value Ref Range   Lipase 22 11 - 51 U/L    Comment: Performed at [facility] [address], [place], [place] [address]  Comprehensive metabolic panel     Status: Abnormal   Collection Time: 04/24/19  4:40 PM  Result Value Ref Range   Sodium 137 135 - 145 mmol/L   Potassium 4.0 3.5 - 5.1 mmol/L   Chloride 103 98 - 111 mmol/L   CO2 25 22 - 32 mmol/L   Glucose, Bld 87 70 - 99 mg/dL   BUN 13 6 - 20 mg/dL   Creatinine, Ser 1.20 0.61 - 1.24 mg/dL   Calcium 9.0 8.9 - 10.3 mg/dL   Total Protein 7.8 6.5 - 8.1 g/dL   Albumin 3.8 3.5 - 5.0 g/dL   AST 17 15 - 41 U/L   ALT 31 0 - 44 U/L   Alkaline Phosphatase 93 38 - 126 U/L  Total Bilirubin 1.4 (H) 0.3 - 1.2 mg/dL   GFR calc non Af Amer >60 >60 mL/min   GFR calc Af Amer >60 >60 mL/min   Anion gap 9 5 - 15    Comment: Performed at [facility] [address] [place], [place] [address]  CBC     Status: Abnormal   Collection Time: 04/24/19  4:40 PM  Result Value Ref Range   WBC 16.7 (H) 4.0 - 10.5 K/uL   RBC 4.85 4.22 - 5.81 MIL/uL   Hemoglobin 14.6 13.0 - 17.0 g/dL   HCT 42.7 39.0 - 52.0 %   MCV 88.0 80.0 - 100.0 fL   MCH 30.1 26.0 - 34.0 pg   MCHC 34.2 30.0 - 36.0 g/dL   RDW 13.5 11.5 - 15.5 %   Platelets 243 150 - 400 K/uL   nRBC 0.0 0.0 - 0.2 %    Comment: Performed at [facility], [place] [address] [place], [place] [address]   CT Abdomen Pelvis W Contrast  Result Date: 04/24/2019 CLINICAL DATA:  Right lower quadrant pain EXAM: CT ABDOMEN AND PELVIS  WITH CONTRAST TECHNIQUE: Multidetector CT imaging of the abdomen and pelvis was performed using the standard protocol following bolus administration of intravenous contrast. CONTRAST:  135mL OMNIPAQUE IOHEXOL 300 MG/ML  SOLN COMPARISON:  None. FINDINGS: Lower chest: Lung bases are clear. No effusions. Heart is normal size. Hepatobiliary: No focal hepatic abnormality. Gallbladder unremarkable. Pancreas: No focal abnormality or ductal dilatation. Spleen: No focal abnormality.  Normal size. Adrenals/Urinary Tract: Small bilateral renal cysts. No renal or ureteral stones. No hydronephrosis. Adrenal glands and urinary bladder unremarkable. Stomach/Bowel: Sigmoid diverticulosis. Inflammatory stranding around the sigmoid colon compatible with active diverticulitis. There is a small extraluminal gas collection adjacent to the sigmoid colon compatible with micro perforation. Vascular/Lymphatic: No evidence of aneurysm or adenopathy. Reproductive: No visible focal abnormality. Other: No free fluid or free air. Musculoskeletal: No acute bony abnormality. IMPRESSION: Sigmoid diverticulosis with associated acute diverticulitis changes. Small extraluminal gas collection adjacent to the sigmoid colon compatible with microperforation. Electronically Signed   By: Rolm Baptise M.D.   On: 04/24/2019 18:54    Review of Systems  HENT: Negative for ear discharge, ear pain, hearing loss and tinnitus.   Eyes: Negative for photophobia and pain.  Respiratory: Negative for cough and shortness of breath.   Cardiovascular: Negative for chest pain.  Gastrointestinal: Positive for abdominal distention and abdominal pain. Negative for nausea and vomiting.  Genitourinary: Negative for dysuria, flank pain, frequency and urgency.  Musculoskeletal: Negative for back pain, myalgias and neck pain.  Neurological: Negative for dizziness and headaches.  Hematological: Does not bruise/bleed easily.  Psychiatric/Behavioral: The patient is not  nervous/anxious.     Blood pressure (!) 122/59, pulse 86, temperature 98.7 F (37.1 C), temperature source Oral, resp. rate (!) 22, height 5\' 6"  (1.676 m), weight 97.5 kg, SpO2 98 %. Physical Exam  WDWN in NAD Eyes:  Pupils equal, round; sclera anicteric HENT:  Oral mucosa moist; good dentition  Neck:  No masses palpated, no thyromegaly Lungs:  CTA bilaterally; normal respiratory effort CV:  Regular rate and rhythm; no murmurs; extremities well-perfused with no edema Abd:  +bowel sounds, mildly distended; tender in lower abdomen - the greatest is in the suprapubic region. Skin:  Warm, dry; no sign of jaundice Psychiatric - alert and oriented x 4; calm mood and affect  Assessment/Plan Acute sigmoid diverticulitis with apparent microperforation behind the bladder  Manage conservatively with  bowel rest, IV antibiotics.  We hope to avoid emergent surgery this admission.   Maia Petties, MD 04/24/2019, 8:09 PM

## 2019-04-24 NOTE — ED Notes (Signed)
Pt sent to ED for RLQ rebound tenderness per Dr. Sabra Heck.  Pt is A&O and ambulated well to the lobby where his ride is going to pick him up and take him to the ED for further evaluation.

## 2019-04-24 NOTE — ED Provider Notes (Signed)
[facility] Emergency Department Provider Note MRN:  [id number]  Arrival date & time: 04/24/19     Chief Complaint   Abdominal Pain   History of Present Illness   Keshaun Derienzo is a 49 y.o. year-old male with no pertinent past medical history presenting to the ED with chief complaint of abdominal pain.  Location: Right lower quadrant Duration: 3 days Onset: Gradual Timing: Constant Description: Dull Severity: Moderate to severe Exacerbating/Alleviating Factors: None, tried milk of magnesia but it did not help Associated Symptoms: None Pertinent Negatives: Denies fever, no vomiting, no diarrhea, no chest pain, shortness of breath, no cough   Review of Systems  A complete 10 system review of systems was obtained and all systems are negative except as noted in the HPI and PMH.   Patient's Health History    Past Medical History:  Diagnosis Date  . Chronic pain of left knee 04/23/2017  . Chronic pain of right knee 11/17/2013   S/P ACL and meniscectomy on right knee in 2014.   Marland Kitchen Elevated transaminase level 12/07/2012  . HYPERLIPIDEMIA 05/13/2007  . Hypertension 11/2010  . OBESITY 05/13/2007  . ROTATOR CUFF INJURY, RIGHT SHOULDER 05/24/2009    Past Surgical History:  Procedure Laterality Date  . KNEE ARTHROSCOPY WITH ANTERIOR CRUCIATE LIGAMENT (ACL) REPAIR Right 2014   Dr Ivin Booty Manson Passey, [facility]) Cadaveric ACL transplant  . MENISCUS REPAIR Right 2014   Dr Ivin Booty Manson Passey, [facility])    Family History  Problem Relation Age of Onset  . Diabetes type II Maternal Uncle   . Hypertension Mother     Social History   Socioeconomic History  . Marital status: Single    Spouse name: Not on file  . Number of children: 3  . Years of education: Not on file  . Highest education level: Not on file  Occupational History  . 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  Smoking status: Never Smoker  . Smokeless tobacco: Never Used  Substance and Sexual Activity  . Alcohol use: Yes    Alcohol/week: 1.0 standard drinks    Types: 1 drink(s) per week  . Drug use: No  . Sexual activity: Not on file  Other Topics Concern  . Not on file  Social History Narrative   Occupation: Education officer, community for [place]   Never Smoked   Drug use-no   Alcohol use-occassional   Married: wife is a Equities trader.   Social Determinants of Health   Financial Resource Strain:   . Difficulty of Paying Living Expenses: Not on file  Food Insecurity:   . Worried About Charity fundraiser in the Last Year: Not on file  . Ran Out of Food in the Last Year: Not on file  Transportation Needs:   . Lack of Transportation (Medical): Not on file  . Lack of Transportation (Non-Medical): Not on file  Physical Activity:   . Days of Exercise per Week: Not on file  . Minutes of Exercise per Session: Not on file  Stress:   . Feeling of Stress : Not on file  Social Connections:   . Frequency of Communication with Friends and Family: Not on file  . Frequency of Social Gatherings with Friends and Family: Not on file  . Attends Religious Services: Not on file  . Active Member of Clubs or Organizations: Not on  file  . Attends Archivist Meetings: Not on file  . Marital Status: Not on file  Intimate Partner Violence:   . Fear of Current or Ex-Partner: Not on file  . Emotionally Abused: Not on file  . Physically Abused: Not on file  . Sexually Abused: Not on file     Physical Exam  Vital Signs and Nursing Notes reviewed Vitals:   04/24/19 1915 04/24/19 1930  BP: 136/78 (!) 122/59  Pulse: 90 86  Resp: 20 (!) 22  Temp:    SpO2: 96% 98%    CONSTITUTIONAL: Well-appearing, NAD NEURO:  Alert and oriented x 3, no focal deficits EYES:  eyes equal and reactive ENT/NECK:  no LAD, no JVD CARDIO: Regular rate,  well-perfused, normal S1 and S2 PULM:  CTAB no wheezing or rhonchi GI/GU:  normal bowel sounds, non-distended, diffuse tenderness to palpation worst in the right lower quadrant MSK/SPINE:  No gross deformities, no edema SKIN:  no rash, atraumatic PSYCH:  Appropriate speech and behavior  Diagnostic and Interventional Summary    EKG Interpretation  Date/Time:    Ventricular Rate:    PR Interval:    QRS Duration:   QT Interval:    QTC Calculation:   R Axis:     Text Interpretation:        Labs Reviewed  COMPREHENSIVE METABOLIC PANEL - Abnormal; Notable for the following components:      Result Value   Total Bilirubin 1.4 (*)    All other components within normal limits  CBC - Abnormal; Notable for the following components:   WBC 16.7 (*)    All other components within normal limits  RESPIRATORY PANEL BY RT PCR (FLU A&B, COVID)  LIPASE, BLOOD  URINALYSIS, ROUTINE W REFLEX MICROSCOPIC    CT Abdomen Pelvis W Contrast  Final Result      Medications  sodium chloride flush (NS) 0.9 % injection 3 mL (3 mLs Intravenous Given 04/24/19 1802)  HYDROmorphone (DILAUDID) injection 0.5 mg (0.5 mg Intravenous Given 04/24/19 1802)  sodium chloride 0.9 % bolus 1,000 mL (1,000 mLs Intravenous New Bag/Given 04/24/19 1801)  iohexol (OMNIPAQUE) 300 MG/ML solution 100 mL (100 mLs Intravenous Contrast Given 04/24/19 1835)     Procedures  /  Critical Care Procedures  ED Course and Medical Decision Making  I have reviewed the triage vital signs, the nursing notes, and pertinent available records from the EMR.  Pertinent labs & imaging results that were available during my care of the patient were reviewed by me and considered in my medical decision making (see below for details).     Question of appendicitis versus right-sided diverticulitis in this 49 year old male otherwise healthy with right lower quadrant pain persistent for 3 days.  Will need CT to exclude.  CT reveals diverticulitis with  microperforation, admitted to general surgery for further care.  Barth Kirks. [place] Small, [place] [email or web address]  Final Clinical Impressions(s) / ED Diagnoses     ICD-10-CM   1. Diverticulitis  K57.92     ED Discharge Orders    None       Discharge Instructions Discussed with and Provided to Patient:   Discharge Instructions   None       Maudie Flakes, MD 04/24/19 1944

## 2019-04-24 NOTE — ED Notes (Signed)
ED TO INPATIENT HANDOFF REPORT  ED Nurse Name and Phone #: Gwyndolyn Saxon [phone]  S Name/Age/Gender Alexander Gentry 49 y.o. male Room/Bed: 049C/049C  Code Status   Code Status: Full Code  Home/SNF/Other Home Patient oriented to: self, place, time and situation Is this baseline? Yes   Triage Complete: Triage complete  Chief Complaint Perforation of sigmoid colon due to diverticulitis [K57.20]  Triage Note abd pain since Friday no n v or diarrhea.  [facility] work  Engineer, manufacturing systems here for treatment    Allergies Allergies  Allergen Reactions  . Penicillins     Unknown  Did it involve swelling of the face/tongue/throat, SOB, or low BP? N/A Did it involve sudden or severe rash/hives, skin peeling, or any reaction on the inside of your mouth or nose?N/A Did you need to seek medical attention at a hospital or doctor's office? N/A When did it last happen?N/A If all above answers are "NO", may proceed with cephalosporin use.    Level of Care/Admitting Diagnosis ED Disposition    ED Disposition Condition Comment   Admit  Hospital Area: [facility] [[id number]]  Level of Care: Med-Surg [16]  Covid Evaluation: Asymptomatic Screening Protocol (No Symptoms)  Diagnosis: Perforation of sigmoid colon due to diverticulitis [id number]  Admitting Physician: CCS, [place]  Attending Physician: CCS, MD [[id number]]  Estimated length of stay: past midnight tomorrow  Certification:: I certify this patient will need inpatient services for at least 2 midnights  Bed request comments: 6N       B Medical/Surgery History Past Medical History:  Diagnosis Date  . Chronic pain of left knee 04/23/2017  . Chronic pain of right knee 11/17/2013   S/P ACL and meniscectomy on right knee in 2014.   Marland Kitchen Elevated transaminase level 12/07/2012  . HYPERLIPIDEMIA 05/13/2007  . Hypertension 11/2010  . OBESITY 05/13/2007  . ROTATOR CUFF INJURY, RIGHT SHOULDER 05/24/2009   Past  Surgical History:  Procedure Laterality Date  . KNEE ARTHROSCOPY WITH ANTERIOR CRUCIATE LIGAMENT (ACL) REPAIR Right 2014   Dr Ivin Booty Manson Passey, [facility]) Cadaveric ACL transplant  . MENISCUS REPAIR Right 2014   Dr Ivin Booty Manson Passey, [facility])     A IV Location/Drains/Wounds Patient Lines/Drains/Airways Status   Active Line/Drains/Airways    Name:   Placement date:   Placement time:   Site:   Days:   Peripheral IV 04/24/19 Right Antecubital   04/24/19    --    Antecubital   less than 1          Intake/Output Last 24 hours  Intake/Output Summary (Last 24 hours) at 04/24/2019 2349 Last data filed at 04/24/2019 2156 Gross per 24 hour  Intake 1213.21 ml  Output --  Net 1213.21 ml    Labs/Imaging Results for orders placed or performed during the hospital encounter of 04/24/19 (from the past 48 hour(s))  Lipase, blood     Status: None   Collection Time: 04/24/19  4:40 PM  Result Value Ref Range   Lipase 22 11 - 51 U/L    Comment: Performed at [facility], [place] [address], [place], [place] [address]  Comprehensive metabolic panel     Status: Abnormal   Collection Time: 04/24/19  4:40 PM  Result Value Ref Range   Sodium 137 135 - 145 mmol/L   Potassium 4.0 3.5 - 5.1 mmol/L   Chloride 103 98 - 111 mmol/L   CO2 25 22 - 32 mmol/L   Glucose, Bld 87  70 - 99 mg/dL   BUN 13 6 - 20 mg/dL   Creatinine, Ser 1.20 0.61 - 1.24 mg/dL   Calcium 9.0 8.9 - 10.3 mg/dL   Total Protein 7.8 6.5 - 8.1 g/dL   Albumin 3.8 3.5 - 5.0 g/dL   AST 17 15 - 41 U/L   ALT 31 0 - 44 U/L   Alkaline Phosphatase 93 38 - 126 U/L   Total Bilirubin 1.4 (H) 0.3 - 1.2 mg/dL   GFR calc non Af Amer >60 >60 mL/min   GFR calc Af Amer >60 >60 mL/min   Anion gap 9 5 - 15    Comment: Performed at [facility], [place] [address], [place], [place] [address]  CBC     Status: Abnormal   Collection Time: 04/24/19  4:40 PM  Result Value Ref Range   WBC 16.7 (H) 4.0 - 10.5 K/uL   RBC 4.85 4.22 - 5.81 MIL/uL    Hemoglobin 14.6 13.0 - 17.0 g/dL   HCT 42.7 39.0 - 52.0 %   MCV 88.0 80.0 - 100.0 fL   MCH 30.1 26.0 - 34.0 pg   MCHC 34.2 30.0 - 36.0 g/dL   RDW 13.5 11.5 - 15.5 %   Platelets 243 150 - 400 K/uL   nRBC 0.0 0.0 - 0.2 %    Comment: Performed at [facility], [place] [address] [place], [place] [address]  Respiratory Panel by RT PCR (Flu A&B, Covid) - Nasopharyngeal Swab     Status: None   Collection Time: 04/24/19  7:44 PM   Specimen: Nasopharyngeal Swab  Result Value Ref Range   SARS Coronavirus 2 by RT PCR NEGATIVE NEGATIVE    Comment: (NOTE) SARS-CoV-2 target nucleic acids are NOT DETECTED. The SARS-CoV-2 RNA is generally detectable in upper respiratoy specimens during the acute phase of infection. The lowest concentration of SARS-CoV-2 viral copies this assay can detect is 131 copies/mL. A negative result does not preclude SARS-Cov-2 infection and should not be used as the sole basis for treatment or other patient management decisions. A negative result may occur with  improper specimen collection/handling, submission of specimen other than nasopharyngeal swab, presence of viral mutation(s) within the areas targeted by this assay, and inadequate number of viral copies (<131 copies/mL). A negative result must be combined with clinical observations, patient history, and epidemiological information. The expected result is Negative. Fact Sheet for Patients:  PinkCheek.be Fact Sheet for Healthcare Providers:  GravelBags.it This test is not yet ap proved or cleared by the [place] FDA and  has been authorized for detection and/or diagnosis of SARS-CoV-2 by FDA under an Emergency Use Authorization (EUA). This EUA will remain  in effect (meaning this test can be used) for the duration of the COVID-19 declaration under Section 564(b)(1) of the Act, 21 U.S.C. section 360bbb-3(b)(1), unless the authorization is  terminated or revoked sooner.    Influenza A by PCR NEGATIVE NEGATIVE   Influenza B by PCR NEGATIVE NEGATIVE    Comment: (NOTE) The Xpert Xpress SARS-CoV-2/FLU/RSV assay is intended as an aid in  the diagnosis of influenza from Nasopharyngeal swab specimens and  should not be used as a sole basis for treatment. Nasal washings and  aspirates are unacceptable for Xpert Xpress SARS-CoV-2/FLU/RSV  testing. Fact Sheet for Patients: PinkCheek.be Fact Sheet for Healthcare Providers: GravelBags.it This test is not yet approved or cleared by the [place] FDA and  has been authorized for detection and/or diagnosis of SARS-CoV-2 by  FDA under an Emergency Use Authorization (EUA). This EUA will remain  in effect (meaning this test can be used) for the duration of the  Covid-19 declaration under Section 564(b)(1) of the Act, 21  U.S.C. section 360bbb-3(b)(1), unless the authorization is  terminated or revoked. Performed at [facility], [place] [address], [place], [place] [address]   Urinalysis, Routine w reflex microscopic     Status: Abnormal   Collection Time: 04/24/19  9:50 PM  Result Value Ref Range   Color, Urine YELLOW YELLOW   APPearance CLEAR CLEAR   Specific Gravity, Urine >1.046 (H) 1.005 - 1.030   pH 5.0 5.0 - 8.0   Glucose, UA NEGATIVE NEGATIVE mg/dL   Hgb urine dipstick NEGATIVE NEGATIVE   Bilirubin Urine NEGATIVE NEGATIVE   Ketones, ur 20 (A) NEGATIVE mg/dL   Protein, ur NEGATIVE NEGATIVE mg/dL   Nitrite NEGATIVE NEGATIVE   Leukocytes,Ua NEGATIVE NEGATIVE    Comment: Performed at [place] [address], [place], [place] [address]   CT Abdomen Pelvis W Contrast  Result Date: 04/24/2019 CLINICAL DATA:  Right lower quadrant pain EXAM: CT ABDOMEN AND PELVIS WITH CONTRAST TECHNIQUE: Multidetector CT imaging of the abdomen and pelvis was performed using the standard protocol following bolus  administration of intravenous contrast. CONTRAST:  143mL OMNIPAQUE IOHEXOL 300 MG/ML  SOLN COMPARISON:  None. FINDINGS: Lower chest: Lung bases are clear. No effusions. Heart is normal size. Hepatobiliary: No focal hepatic abnormality. Gallbladder unremarkable. Pancreas: No focal abnormality or ductal dilatation. Spleen: No focal abnormality.  Normal size. Adrenals/Urinary Tract: Small bilateral renal cysts. No renal or ureteral stones. No hydronephrosis. Adrenal glands and urinary bladder unremarkable. Stomach/Bowel: Sigmoid diverticulosis. Inflammatory stranding around the sigmoid colon compatible with active diverticulitis. There is a small extraluminal gas collection adjacent to the sigmoid colon compatible with micro perforation. Vascular/Lymphatic: No evidence of aneurysm or adenopathy. Reproductive: No visible focal abnormality. Other: No free fluid or free air. Musculoskeletal: No acute bony abnormality. IMPRESSION: Sigmoid diverticulosis with associated acute diverticulitis changes. Small extraluminal gas collection adjacent to the sigmoid colon compatible with microperforation. Electronically Signed   By: Rolm Baptise M.D.   On: 04/24/2019 18:54    Pending Labs Unresulted Labs (From admission, onward)    Start     Ordered   04/25/19 [id number]  Basic metabolic panel  Tomorrow morning,   R     04/24/19 2013   04/25/19 0500  CBC  Tomorrow morning,   R     04/24/19 2013   04/24/19 2013  HIV Antibody (routine testing w rflx)  (HIV Antibody (Routine testing w reflex) panel)  Once,   STAT     04/24/19 2013          Vitals/Pain Today's Vitals   04/24/19 2030 04/24/19 2139 04/24/19 2230 04/24/19 2239  BP:      Pulse:      Resp:      Temp:      TempSrc:      SpO2:      Weight:      Height:      PainSc: 0-No pain 0-No pain 8  8     Isolation Precautions No active isolations  Medications Medications  hydrochlorothiazide (HYDRODIURIL) tablet 12.5 mg (has no administration in time range)   atorvastatin (LIPITOR) tablet 40 mg (40 mg Oral Given 04/24/19 2027)  enoxaparin (LOVENOX) injection 40 mg (40 mg Subcutaneous Given 04/24/19 2237)  0.9 % NaCl with KCl 20 mEq/ L  infusion (1,000 mLs Intravenous New Bag/Given 04/24/19 2202)  ceFEPIme (MAXIPIME) 2 g in sodium chloride 0.9 % 100 mL IVPB (0 g Intravenous Stopped 04/24/19 2133)    And  metroNIDAZOLE (FLAGYL) IVPB 500 mg (0 mg Intravenous Stopped 04/24/19 2156)  acetaminophen (TYLENOL) tablet 650 mg (has no administration in time range)    Or  acetaminophen (TYLENOL) suppository 650 mg (has no administration in time range)  HYDROmorphone (DILAUDID) injection 1 mg (1 mg Intravenous Given 04/24/19 2250)  diphenhydrAMINE (BENADRYL) capsule 25 mg (has no administration in time range)    Or  diphenhydrAMINE (BENADRYL) injection 25 mg (has no administration in time range)  ondansetron (ZOFRAN-ODT) disintegrating tablet 4 mg (has no administration in time range)    Or  ondansetron (ZOFRAN) injection 4 mg (has no administration in time range)  docusate sodium (COLACE) capsule 100 mg (100 mg Oral Given 04/24/19 2237)  simethicone (MYLICON) chewable tablet 40 mg (has no administration in time range)  sodium chloride flush (NS) 0.9 % injection 3 mL (3 mLs Intravenous Given 04/24/19 1802)  HYDROmorphone (DILAUDID) injection 0.5 mg (0.5 mg Intravenous Given 04/24/19 1802)  sodium chloride 0.9 % bolus 1,000 mL (0 mLs Intravenous Stopped 04/24/19 2031)  iohexol (OMNIPAQUE) 300 MG/ML solution 100 mL (100 mLs Intravenous Contrast Given 04/24/19 1835)    Mobility walks Low fall risk   Focused Assessments    R Recommendations: See Admitting Provider Note  Report given to:   Additional Notes: Patient is from home with abdominal pain and dx with Diverticulosis; Patient has intermittent pain and last given pain med at 2250; Patient is A&Ox4 and ambulatory; No c/o at this time; pt is NPO/sips with meds for bowel rest in hopes of avoiding emergency  surgery; 1st round of IV antibiotics given in ED-[place],RN

## 2019-04-24 NOTE — ED Provider Notes (Signed)
[facility]    CSN: [id number] Arrival date & time: 04/24/19  1238      History   Chief Complaint Chief Complaint  Patient presents with  . Abdominal Pain    HPI Zedric Montee is a 49 y.o. male.  Patient with generalized lower abdominal pain.  Present x3 days.  Denies anorexia nausea or vomiting.  Felt a little constipated and took milk of magnesia; had results but no relief of symptoms.   HPI  Past Medical History:  Diagnosis Date  . Chronic pain of left knee 04/23/2017  . Chronic pain of right knee 11/17/2013   S/P ACL and meniscectomy on right knee in 2014.   Marland Kitchen Elevated transaminase level 12/07/2012  . HYPERLIPIDEMIA 05/13/2007  . Hypertension 11/2010  . OBESITY 05/13/2007  . ROTATOR CUFF INJURY, RIGHT SHOULDER 05/24/2009    Patient Active Problem List   Diagnosis Date Noted  . Chronic pain of left knee 04/23/2017  . Chronic pain of right knee 11/17/2013  . Hypertension goal BP (blood pressure) < 140/90 06/22/2008    Class: Chronic  . Hyperlipidemia LDL goal <130 05/13/2007  . OBESITY 05/13/2007    Past Surgical History:  Procedure Laterality Date  . KNEE ARTHROSCOPY WITH ANTERIOR CRUCIATE LIGAMENT (ACL) REPAIR Right 2014   Dr Ivin Booty Manson Passey, [facility]) Cadaveric ACL transplant  . MENISCUS REPAIR Right 2014   Dr Ivin Booty Manson Passey, [facility])       Home Medications    Prior to Admission medications   Medication Sig Start Date End Date Taking? Authorizing Provider  aspirin EC 81 MG tablet Take 1 tablet (81 mg total) by mouth daily. 04/22/12   McDiarmid, Blane Ohara, MD  atorvastatin (LIPITOR) 40 MG tablet TAKE 1 TABLET BY MOUTH EVERY DAY 11/08/18   McDiarmid, Blane Ohara, MD  doxycycline (VIBRAMYCIN) 100 MG capsule Take 1 capsule (100 mg total) by mouth 2 (two) times daily. 09/22/17   Raylene Everts, MD  fluticasone [facility]) 50 MCG/ACT nasal spray SPRAY 2 SPRAYS INTO EACH NOSTRIL EVERY DAY 09/07/18   McDiarmid, Blane Ohara, MD  hydrochlorothiazide  (HYDRODIURIL) 25 MG tablet Take 0.5 tablets (12.5 mg total) by mouth daily. 08/13/18   McDiarmid, Blane Ohara, MD    Family History Family History  Problem Relation Age of Onset  . Diabetes type II Maternal Uncle   . Hypertension Mother     Social History Social History   Tobacco Use  . Smoking status: Never Smoker  . Smokeless tobacco: Never Used  Substance Use Topics  . Alcohol use: Yes    Alcohol/week: 1.0 standard drinks    Types: 1 drink(s) per week  . Drug use: No     Allergies   Penicillins   Review of Systems Review of Systems  Gastrointestinal: Positive for abdominal pain and constipation. Negative for nausea.  All other systems reviewed and are negative.    Physical Exam Triage Vital Signs ED Triage Vitals  Enc Vitals Group     BP 04/24/19 1504 128/69     Pulse Rate 04/24/19 1504 96     Resp 04/24/19 1504 17     Temp 04/24/19 1504 99.7 F (37.6 C)     Temp Source 04/24/19 1504 Oral     SpO2 04/24/19 1504 100 %     Weight --      Height --      Head Circumference --      Peak Flow --      Pain Score 04/24/19 1503 8  Pain Loc --      Pain Edu? --      Excl. in [place]? --    No data found.  Updated Vital Signs BP 128/69 (BP Location: Right Arm)   Pulse 96   Temp 99.7 F (37.6 C) (Oral)   Resp 17   SpO2 100%   Visual Acuity Right Eye Distance:   Left Eye Distance:   Bilateral Distance:    Right Eye Near:   Left Eye Near:    Bilateral Near:     Physical Exam Vitals and nursing note reviewed.  Constitutional:      Appearance: He is well-developed.  Abdominal:     General: Bowel sounds are decreased.     Palpations: Abdomen is soft.     Tenderness: There is abdominal tenderness in the right lower quadrant and left lower quadrant.     Comments: Patient has rebound tenderness right greater than left but generalized tenderness and guarding  Neurological:     General: No focal deficit present.     Mental Status: He is alert.      UC  Treatments / Results  Labs (all labs ordered are listed, but only abnormal results are displayed) Labs Reviewed - No data to display  EKG   Radiology No results found.  Procedures Procedures (including critical care time)  Medications Ordered in UC Medications - No data to display  Initial Impression / Assessment and Plan / UC Course  I have reviewed the triage vital signs and the nursing notes.  Pertinent labs & imaging results that were available during my care of the patient were reviewed by me and considered in my medical decision making (see chart for details).     Lower abdominal pain with rebound tenderness in the right lower quadrant.  Even though he has not been anorectic or febrile concerned about acute abdomen such as appendicitis or ruptured diverticulum Final Clinical Impressions(s) / UC Diagnoses   Final diagnoses:  None   Discharge Instructions   None    ED Prescriptions    None     PDMP not reviewed this encounter.   Wardell Honour, MD 04/24/19 1539

## 2019-04-24 NOTE — ED Triage Notes (Signed)
Pt presents with lower abdominal pain since Friday that he states feels like "spasms"; no NVD.

## 2019-04-25 LAB — BASIC METABOLIC PANEL
Anion gap: 8 (ref 5–15)
BUN: 12 mg/dL (ref 6–20)
CO2: 26 mmol/L (ref 22–32)
Calcium: 8.5 mg/dL — ABNORMAL LOW (ref 8.9–10.3)
Chloride: 107 mmol/L (ref 98–111)
Creatinine, Ser: 1.05 mg/dL (ref 0.61–1.24)
GFR calc Af Amer: >60 mL/min (ref >60)
GFR calc non Af Amer: >60 mL/min (ref >60)
Glucose, Bld: 79 mg/dL (ref 70–99)
Potassium: 4.1 mmol/L (ref 3.5–5.1)
Sodium: 141 mmol/L (ref 135–145)

## 2019-04-25 LAB — CBC
HCT: 39.7 % (ref 39.0–52.0)
Hemoglobin: 13.3 g/dL (ref 13.0–17.0)
MCH: 30 pg (ref 26.0–34.0)
MCHC: 33.5 g/dL (ref 30.0–36.0)
MCV: 89.4 fL (ref 80.0–100.0)
Platelets: 214 10*3/uL (ref 150–400)
RBC: 4.44 MIL/uL (ref 4.22–5.81)
RDW: 13.5 % (ref 11.5–15.5)
WBC: 15.1 10*3/uL — ABNORMAL HIGH (ref 4.0–10.5)
nRBC: 0 % (ref 0.0–0.2)

## 2019-04-25 LAB — HIV ANTIBODY (ROUTINE TESTING W REFLEX): HIV Screen 4th Generation wRfx: NONREACTIVE

## 2019-04-25 MED ORDER — HYDROCHLOROTHIAZIDE 25 MG PO TABS: 12.5000 mg | ORAL_TABLET | Freq: Every day | ORAL | Status: DC

## 2019-04-25 MED ORDER — INFLUENZA VAC SPLIT QUAD 0.5 ML IM SUSY: 0.5000 mL | PREFILLED_SYRINGE | INTRAMUSCULAR | Status: DC | PRN

## 2019-04-25 MED ORDER — HYDROCHLOROTHIAZIDE 25 MG PO TABS
12.5000 mg | ORAL_TABLET | Freq: Every day | ORAL | Status: DC
  Administered 2019-04-25 – 2019-04-27 (×3): 12.5 mg via ORAL
  Filled 2019-04-25 (×3): qty 1

## 2019-04-25 NOTE — Progress Notes (Signed)
   Subjective/Chief Complaint: Reports abdominal pain less No nausea Not passing flatus   Objective: Vital signs in last 24 hours: Temp:  [98.5 F (36.9 C)-99.7 F (37.6 C)] 98.5 F (36.9 C) (01/11 0631) Pulse Rate:  [78-96] 78 (01/11 0631) Resp:  [15-22] 18 (01/11 0112) BP: (117-142)/(51-87) 138/76 (01/11 0631) SpO2:  [96 %-100 %] 100 % (01/11 0631) Weight:  [97.5 kg] 97.5 kg (01/10 1629) Last BM Date: 04/24/19  Intake/Output from previous day: 01/10 0701 - 01/11 0700 In: 2128.5 [I.V.:915.3; IV Piggyback:1213.2] Out: -  Intake/Output this shift: No intake/output data recorded.  Exam: Awake and alert, up in a chair Looks comfortable Abdomen soft with some LLQ tenderness and slight guarding  Lab Results:  Recent Labs    04/24/19 1640 04/25/19 0123  WBC 16.7* 15.1*  HGB 14.6 13.3  HCT 42.7 39.7  PLT 243 214   BMET Recent Labs    04/24/19 1640 04/25/19 0123  NA 137 141  K 4.0 4.1  CL 103 107  CO2 25 26  GLUCOSE 87 79  BUN 13 12  CREATININE 1.20 1.05  CALCIUM 9.0 8.5*   PT/INR No results for input(s): LABPROT, INR in the last 72 hours. ABG No results for input(s): PHART, HCO3 in the last 72 hours.  Invalid input(s): PCO2, PO2  Studies/Results: CT Abdomen Pelvis W Contrast  Result Date: 04/24/2019 CLINICAL DATA:  Right lower quadrant pain EXAM: CT ABDOMEN AND PELVIS WITH CONTRAST TECHNIQUE: Multidetector CT imaging of the abdomen and pelvis was performed using the standard protocol following bolus administration of intravenous contrast. CONTRAST:  183mL OMNIPAQUE IOHEXOL 300 MG/ML  SOLN COMPARISON:  None. FINDINGS: Lower chest: Lung bases are clear. No effusions. Heart is normal size. Hepatobiliary: No focal hepatic abnormality. Gallbladder unremarkable. Pancreas: No focal abnormality or ductal dilatation. Spleen: No focal abnormality.  Normal size. Adrenals/Urinary Tract: Small bilateral renal cysts. No renal or ureteral stones. No hydronephrosis.  Adrenal glands and urinary bladder unremarkable. Stomach/Bowel: Sigmoid diverticulosis. Inflammatory stranding around the sigmoid colon compatible with active diverticulitis. There is a small extraluminal gas collection adjacent to the sigmoid colon compatible with micro perforation. Vascular/Lymphatic: No evidence of aneurysm or adenopathy. Reproductive: No visible focal abnormality. Other: No free fluid or free air. Musculoskeletal: No acute bony abnormality. IMPRESSION: Sigmoid diverticulosis with associated acute diverticulitis changes. Small extraluminal gas collection adjacent to the sigmoid colon compatible with microperforation. Electronically Signed   By: Rolm Baptise M.D.   On: 04/24/2019 18:54    Anti-infectives: Anti-infectives (From admission, onward)   Start     Dose/Rate Route Frequency Ordered Stop   04/24/19 2100  ceFEPIme (MAXIPIME) 2 g in sodium chloride 0.9 % 100 mL IVPB     2 g 200 mL/hr over 30 Minutes Intravenous Every 8 hours 04/24/19 2013     04/24/19 2100  metroNIDAZOLE (FLAGYL) IVPB 500 mg     500 mg 100 mL/hr over 60 Minutes Intravenous Every 8 hours 04/24/19 2013        Assessment/Plan:  Sigmoid diverticulitis with microperforation  WBC down slightly and clinically improving  Will start clears Continue IV antibiotics Decrease IVF Will need GI follow-up as an outpt to consider colonoscopy  LOS: 1 day    Coralie Keens 04/25/2019

## 2019-04-25 NOTE — Plan of Care (Signed)
  Problem: Education: Goal: Knowledge of General Education information will improve Description Including pain rating scale, medication(s)/side effects and non-pharmacologic comfort measures Outcome: Progressing   Problem: Education: Goal: Knowledge of General Education information will improve Description Including pain rating scale, medication(s)/side effects and non-pharmacologic comfort measures Outcome: Progressing   

## 2019-04-25 NOTE — Plan of Care (Signed)

## 2019-04-26 LAB — CBC
HCT: 35.5 % — ABNORMAL LOW (ref 39.0–52.0)
Hemoglobin: 12.2 g/dL — ABNORMAL LOW (ref 13.0–17.0)
MCH: 29.8 pg (ref 26.0–34.0)
MCHC: 34.4 g/dL (ref 30.0–36.0)
MCV: 86.6 fL (ref 80.0–100.0)
Platelets: 211 10*3/uL (ref 150–400)
RBC: 4.1 MIL/uL — ABNORMAL LOW (ref 4.22–5.81)
RDW: 12.8 % (ref 11.5–15.5)
WBC: 11.1 10*3/uL — ABNORMAL HIGH (ref 4.0–10.5)
nRBC: 0 % (ref 0.0–0.2)

## 2019-04-26 NOTE — Progress Notes (Signed)
[facility] Progress Note     Subjective: CC-  Overall feeling better but still having some lower abdominal pain. Denies n/v. Tolerating clear liquids. WBC trending down 11.1, TMAX 99.8  Objective: Vital signs in last 24 hours: Temp:  [98.3 F (36.8 C)-99.8 F (37.7 C)] 98.3 F (36.8 C) (01/12 0443) Pulse Rate:  [72-80] 72 (01/12 0443) Resp:  [15-16] 16 (01/12 0443) BP: (126-133)/(67-78) 126/74 (01/12 0443) SpO2:  [100 %] 100 % (01/12 0443) Last BM Date: 04/24/19  Intake/Output from previous day: 01/11 0701 - 01/12 0700 In: 240 [P.O.:240] Out: -  Intake/Output this shift: No intake/output data recorded.  PE: Gen:  Alert, NAD, pleasant HEENT: EOM's intact, pupils equal and round Pulm:  Rate and effort normal Abd: Soft, ND, +BS, no HSM, mild LLQ and RLQ TTP with voluntary guarding Psych: A&Ox3  Skin: no rashes noted, warm and dry  Lab Results:  Recent Labs    04/25/19 0123 04/26/19 0225  WBC 15.1* 11.1*  HGB 13.3 12.2*  HCT 39.7 35.5*  PLT 214 211   BMET Recent Labs    04/24/19 1640 04/25/19 0123  NA 137 141  K 4.0 4.1  CL 103 107  CO2 25 26  GLUCOSE 87 79  BUN 13 12  CREATININE 1.20 1.05  CALCIUM 9.0 8.5*   PT/INR No results for input(s): LABPROT, INR in the last 72 hours. CMP     Component Value Date/Time   NA 141 04/25/2019 0123   NA 140 04/23/2017 1002   K 4.1 04/25/2019 0123   CL 107 04/25/2019 0123   CO2 26 04/25/2019 0123   GLUCOSE 79 04/25/2019 0123   BUN 12 04/25/2019 0123   BUN 14 04/23/2017 1002   CREATININE 1.05 04/25/2019 0123   CREATININE 1.03 12/20/2015 1222   CALCIUM 8.5 (L) 04/25/2019 0123   PROT 7.8 04/24/2019 1640   ALBUMIN 3.8 04/24/2019 1640   AST 17 04/24/2019 1640   ALT 31 04/24/2019 1640   ALKPHOS 93 04/24/2019 1640   BILITOT 1.4 (H) 04/24/2019 1640   GFRNONAA >60 04/25/2019 0123   GFRNONAA 87 12/20/2015 1222   GFRAA >60 04/25/2019 0123   GFRAA >89 12/20/2015 1222   Lipase     Component Value  Date/Time   LIPASE 22 04/24/2019 1640       Studies/Results: CT Abdomen Pelvis W Contrast  Result Date: 04/24/2019 CLINICAL DATA:  Right lower quadrant pain EXAM: CT ABDOMEN AND PELVIS WITH CONTRAST TECHNIQUE: Multidetector CT imaging of the abdomen and pelvis was performed using the standard protocol following bolus administration of intravenous contrast. CONTRAST:  124mL OMNIPAQUE IOHEXOL 300 MG/ML  SOLN COMPARISON:  None. FINDINGS: Lower chest: Lung bases are clear. No effusions. Heart is normal size. Hepatobiliary: No focal hepatic abnormality. Gallbladder unremarkable. Pancreas: No focal abnormality or ductal dilatation. Spleen: No focal abnormality.  Normal size. Adrenals/Urinary Tract: Small bilateral renal cysts. No renal or ureteral stones. No hydronephrosis. Adrenal glands and urinary bladder unremarkable. Stomach/Bowel: Sigmoid diverticulosis. Inflammatory stranding around the sigmoid colon compatible with active diverticulitis. There is a small extraluminal gas collection adjacent to the sigmoid colon compatible with micro perforation. Vascular/Lymphatic: No evidence of aneurysm or adenopathy. Reproductive: No visible focal abnormality. Other: No free fluid or free air. Musculoskeletal: No acute bony abnormality. IMPRESSION: Sigmoid diverticulosis with associated acute diverticulitis changes. Small extraluminal gas collection adjacent to the sigmoid colon compatible with microperforation. Electronically Signed   By: Rolm Baptise M.D.   On: 04/24/2019 18:54    Anti-infectives:  Anti-infectives (From admission, onward)   Start     Dose/Rate Route Frequency Ordered Stop   04/24/19 2100  ceFEPIme (MAXIPIME) 2 g in sodium chloride 0.9 % 100 mL IVPB     2 g 200 mL/hr over 30 Minutes Intravenous Every 8 hours 04/24/19 2013     04/24/19 2100  metroNIDAZOLE (FLAGYL) IVPB 500 mg     500 mg 100 mL/hr over 60 Minutes Intravenous Every 8 hours 04/24/19 2013          Assessment/Plan HTN HLD  Sigmoid diverticulitis with microperforation - first bout, never had a colonoscopy - Pain improving and WBC trending down. Advance to full liquids, and advance diet as tolerated. Mobilize. Continue IV antibiotics. Will recheck this afternoon for possible discharge, but I suspect he will likely go home tomorrow on oral antibiotics.  ID - maxipime/flagyl 1/10>> FEN - decrease IVF, FLD VTE - SCDs, lovenox Foley - none Follow up - GI for colonoscopy in 6-8 wees   LOS: 2 days    Wellington Hampshire, [facility] 04/26/2019, 9:11 AM Please see Amion for pager number during day hours 7:00am-4:30pm

## 2019-04-26 NOTE — Plan of Care (Signed)
  Problem: Education: Goal: Knowledge of General Education information will improve Description Including pain rating scale, medication(s)/side effects and non-pharmacologic comfort measures Outcome: Progressing   

## 2019-04-26 NOTE — Plan of Care (Signed)

## 2019-04-27 ENCOUNTER — Encounter: Payer: Self-pay | Admitting: Internal Medicine

## 2019-04-27 LAB — CBC
HCT: 34.9 % — ABNORMAL LOW (ref 39.0–52.0)
Hemoglobin: 12.3 g/dL — ABNORMAL LOW (ref 13.0–17.0)
MCH: 30 pg (ref 26.0–34.0)
MCHC: 35.2 g/dL (ref 30.0–36.0)
MCV: 85.1 fL (ref 80.0–100.0)
Platelets: 215 10*3/uL (ref 150–400)
RBC: 4.1 MIL/uL — ABNORMAL LOW (ref 4.22–5.81)
RDW: 12.5 % (ref 11.5–15.5)
WBC: 8.8 10*3/uL (ref 4.0–10.5)
nRBC: 0 % (ref 0.0–0.2)

## 2019-04-27 MED ORDER — OXYCODONE HCL 5 MG PO TABS: 5.0000 mg | ORAL_TABLET | Freq: Four times a day (QID) | ORAL | 0 refills | Status: DC | PRN

## 2019-04-27 MED ORDER — OXYCODONE HCL 5 MG PO TABS
5.0000 mg | ORAL_TABLET | ORAL | Status: DC | PRN
  Administered 2019-04-27: 5 mg via ORAL
  Filled 2019-04-27: qty 1

## 2019-04-27 MED ORDER — METRONIDAZOLE 500 MG PO TABS: 500.0000 mg | ORAL_TABLET | Freq: Three times a day (TID) | ORAL | 0 refills | Status: AC

## 2019-04-27 MED ORDER — HYDROMORPHONE HCL 1 MG/ML IJ SOLN: 1.0000 mg | INTRAMUSCULAR | Status: DC | PRN

## 2019-04-27 MED ORDER — ACETAMINOPHEN 325 MG PO TABS: 650.0000 mg | ORAL_TABLET | Freq: Four times a day (QID) | ORAL | Status: DC | PRN

## 2019-04-27 MED ORDER — CIPROFLOXACIN HCL 500 MG PO TABS: 500.0000 mg | ORAL_TABLET | Freq: Two times a day (BID) | ORAL | 0 refills | Status: AC

## 2019-04-27 NOTE — Discharge Summary (Signed)
Alexander Gentry   Patient ID: Alexander Gentry MRN: [id number] DOB/AGE: 1970/06/08 49 y.o.  Admit date: 04/24/2019 Discharge date: 04/27/2019  Admitting Diagnosis: Acute sigmoid diverticulitis with microperforation  Discharge Diagnosis Patient Active Problem List   Diagnosis Date Noted  . Perforation of sigmoid colon due to diverticulitis 04/24/2019  . Chronic pain of left knee 04/23/2017  . Chronic pain of right knee 11/17/2013  . Hypertension goal BP (blood pressure) < 140/90 06/22/2008  . Hyperlipidemia LDL goal <130 05/13/2007  . OBESITY 05/13/2007    Consultants None  Imaging: No results found.  Procedures None  Hospital Course:  Alexander Gentry is a 49yo male who presented to [facility] 1/10 with 3 days of worsening lower abdominal pain.  He was evaluated by the ED and found to have diverticulitis with an apparent microperforation behind his bladder. Patient was admitted to the surgical service and placed on IV antibiotics and bowel rest. As leukocytosis and abdominal pain improved, diet was advanced as tolerated. On 1/13, the patient was tolerating diet, having bowel function, vital signs stable and felt stable for discharge home. He was discharged with 10 days of oral cipro/flagyl, and will follow up with gastroenterology to arrange for colonoscopy in 6-8 weeks.  Patient will follow up as below and knows to call with questions or concerns.    I have personally reviewed the patients medication history on the [place] controlled substance database.   Physical Exam: Gen:  Alert, NAD, pleasant HEENT: EOM's intact, pupils equal and round Pulm:  Rate and effort normal Abd: Soft, ND, nontender, +BS, no HSM Skin: no rashes noted, warm and dry   Allergies as of 04/27/2019      Reactions   Penicillins    Unknown Did it involve swelling of the face/tongue/throat, SOB, or low BP? N/A Did it involve sudden or severe rash/hives, skin peeling, or any reaction on  the inside of your mouth or nose?N/A Did you need to seek medical attention at a hospital or doctor's office? N/A When did it last happen?N/A If all above answers are "NO", may proceed with cephalosporin use.      Medication List    TAKE these medications   acetaminophen 325 MG tablet Commonly known as: TYLENOL Take 2 tablets (650 mg total) by mouth every 6 (six) hours as needed for mild pain (or temp > 100).   aspirin EC 81 MG tablet Take 1 tablet (81 mg total) by mouth daily.   atorvastatin 40 MG tablet Commonly known as: LIPITOR TAKE 1 TABLET BY MOUTH EVERY DAY   ciprofloxacin 500 MG tablet Commonly known as: Cipro Take 1 tablet (500 mg total) by mouth 2 (two) times daily for 10 days.   hydrochlorothiazide 25 MG tablet Commonly known as: HYDRODIURIL Take 0.5 tablets (12.5 mg total) by mouth daily.   metroNIDAZOLE 500 MG tablet Commonly known as: Flagyl Take 1 tablet (500 mg total) by mouth 3 (three) times daily for 10 days.   oxyCODONE 5 MG immediate release tablet Commonly known as: Oxy IR/ROXICODONE Take 1 tablet (5 mg total) by mouth every 6 (six) hours as needed for severe pain.        Follow-up [facility], [place]. Call.   Specialty: General Surgery Why: as needed for follow up after colonoscopy Contact information: [address] [place]       McDiarmid, Blane Ohara, MD. Call.   Specialty: Family Medicine Why: call  to arrange post-hospitalization follow up appointment Contact information: [place] [address] [phone]        [facility]. Call.   Specialty: Gastroenterology Why: Their office will contact you to arrange an appointment. You will need to have a colonoscopy in 6-8 weeks Contact information: [place] [phone]          Signed: Wellington Hampshire,  [facility] 04/27/2019, 8:34 AM Please see Amion for pager number during day hours 7:00am-4:30pm

## 2019-04-27 NOTE — Discharge Instructions (Signed)
Diverticulitis  Diverticulitis is when small pockets in your large intestine (colon) get infected or swollen. This causes stomach pain and watery poop (diarrhea). These pouches are called diverticula. They form in people who have a condition called diverticulosis. Follow these instructions at home: Medicines  Take over-the-counter and prescription medicines only as told by your doctor. These include: ? Antibiotics. ? Pain medicines. ? Fiber pills. ? Probiotics. ? Stool softeners.  Do not drive or use heavy machinery while taking prescription pain medicine.  If you were prescribed an antibiotic, take it as told. Do not stop taking it even if you feel better. General instructions   Follow a diet as told by your doctor.  When you feel better, your doctor may tell you to change your diet. You may need to eat a lot of fiber. Fiber makes it easier to poop (have bowel movements). Healthy foods with fiber include: ? Berries. ? Beans. ? Lentils. ? Green vegetables.  Exercise 3 or more times a week. Aim for 30 minutes each time. Exercise enough to sweat and make your heart beat faster.  Keep all follow-up visits as told. This is important. You may need to have an exam of the large intestine. This is called a colonoscopy. Contact a doctor if:  Your pain does not get better.  You have a hard time eating or drinking.  You are not pooping like normal. Get help right away if:  Your pain gets worse.  Your problems do not get better.  Your problems get worse very fast.  You have a fever.  You throw up (vomit) more than one time.  You have poop that is: ? Bloody. ? Black. ? Tarry. Summary  Diverticulitis is when small pockets in your large intestine (colon) get infected or swollen.  Take medicines only as told by your doctor.  Follow a diet as told by your doctor. This information is not intended to replace advice given to you by your health care provider. Make sure you  discuss any questions you have with your health care provider. Document Revised: 03/13/2017 Document Reviewed: 04/17/2016 Elsevier Patient Education  [place].    Low-Fiber Eating Plan Fiber is found in fruits, vegetables, whole grains, and beans. Eating a diet low in fiber helps to reduce how often you have bowel movements and how much you produce during a bowel movement. A low-fiber eating plan may help your digestive system heal if:  You have certain conditions, such as Crohn's disease or diverticulitis.  You recently had radiation therapy on your pelvis or bowel.  You recently had intestinal surgery.  You have a new surgical opening in your abdomen (colostomy or ileostomy).  Your intestine is narrowed (stricture). Your health care provider will determine how long you need to stay on this diet. Your health care provider may recommend that you work with a diet and nutrition specialist (dietitian). What are tips for following this plan? General guidelines  Follow recommendations from your dietitian about how much fiber you should have each day.  Most people on this eating plan should try to eat less than 10 grams (g) of fiber each day. Your daily fiber goal is _________________ g.  Take vitamin and mineral supplements as told by your health care provider or dietitian. Chewable or liquid forms are best when on this eating plan. Reading food labels  Check food labels for the amount of dietary fiber.  Choose foods that have less than 2 grams of fiber in one  serving. Cooking  Use white flour and other allowed grains for baking and cooking.  Cook meat using methods that keep it tender, such as braising or poaching.  Cook eggs until the yolk is completely solid.  Cook with healthy oils, such as olive oil or canola oil. Meal planning   Eat 5-6 small meals throughout the day instead of 3 large meals.  If you are lactose intolerant: ? Choose low-lactose dairy  foods. ? Do not eat dairy foods, if told by your dietitian.  Limit fat and oils to less than 8 teaspoons a day.  Eat small portions of desserts. What foods are allowed? The items listed below may not be a complete list. Talk with your dietitian about what dietary choices are best for you. Grains All bread and crackers made with white flour. Waffles, pancakes, and [place] toast. Bagels. Pretzels. Melba toast, zwieback, and matzoh. Cooked and dried cereals that do not contain whole grains, added fiber, seeds, or dried fruit. CornmealDomenick Gentry. Hot and cold cereals made with refined corn, wheat, rice, or oats. Plain pasta and noodles. White rice. Vegetables Well-cooked or canned vegetables without skin, seeds, or stems. Cooked potatoes without skins. Vegetable juice. Fruits Soft-cooked or canned fruits without skin and seeds. Peeled ripe banana. Applesauce. Fruit juice without pulp. Meats and other protein foods Ground meat. Tender cuts of meat or poultry. Eggs. Fish, seafood, and shellfish. Smooth nut butters. Tofu. Dairy All milk products and drinks. Lactose-free milks, including rice, soy, and almond milks. Yogurt without fruit, nuts, chocolate, or granola mix-ins. Sour cream. Cottage cheese. Cheese. Beverages Decaf coffee. Fruit and vegetable juices or smoothies (in small amounts, with no pulp or skins, and with fruits from allowed list). Sports drinks. Herbal tea. Fats and oils Olive oil, canola oil, sunflower oil, flaxseed oil, and grapeseed oil. Mayonnaise. Cream cheese. Margarine. Butter. Sweets and desserts Plain cakes and cookies. Cream pies and pies made with allowed fruits. Pudding. Custard. Fruit gelatin. Sherbet. Popsicles. Ice cream without nuts. Plain hard candy. Honey. Jelly. Molasses. Syrups, including chocolate syrup. Chocolate. Marshmallows. Gumdrops. Seasoning and other foods Bouillon. Broth. Cream soups made from allowed foods. Strained soup. Casseroles made with allowed  foods. Ketchup. Mild mustard. Mild salad dressings. Plain gravies. Vinegar. Spices in moderation. Salt. Sugar. What foods are not allowed? The items listed below may not be a complete list. Talk with your dietitian about what dietary choices are best for you. Grains Whole wheat and whole grain breads and crackers. Multigrain breads and crackers. Rye bread. Whole grain or multigrain cereals. Cereals with nuts, raisins, or coconut. Bran. Coarse wheat cereals. Granola. High-fiber cereals. Cornmeal or corn bread. Whole grain pasta. Wild or brown rice. Quinoa. Popcorn. Buckwheat. Wheat germ. Vegetables Potato skins. Raw or undercooked vegetables. All beans and bean sprouts. Cooked greens. Corn. Peas. Cabbage. Beets. Broccoli. Brussels sprouts. Cauliflower. Mushrooms. Onions. Peppers. Parsnips. Okra. Sauerkraut. Fruit Raw or dried fruit. Berries. Fruit juice with pulp. Prune juice. Meats and other protein foods Tough, fibrous meats with gristle. Fatty meat. Poultry with skin. Fried meat, Sales executive, or fish. Deli or lunch meats. Sausage, bacon, and hot dogs. Nuts and chunky nut butter. Dried peas, beans, and lentils. Dairy Yogurt with fruit, nuts, chocolate, or granola mix-ins. Beverages Caffeinated coffee and teas. Fats and oils Avocado. Coconut. Sweets and desserts Desserts, cookies, or candies that contain nuts or coconut. Dried fruit. Jams and preserves with seeds. Marmalade. Any dessert made with fruits or grains that are not allowed. Seasoning and other foods Corn tortilla  chips. Soups made with vegetables or grains that are not allowed. Relish. Horseradish. Alexander Gentry. Olives. Summary  Most people on a low-fiber eating plan should eat less than 10 grams of fiber a day. Follow recommendations from your dietitian about how much fiber you should have each day.  Always check food labels to see the dietary fiber content of packaged foods. In general, a low-fiber food will have fewer than 2 grams of  fiber per serving.  In general, try to avoid whole grains, raw fruits and vegetables, dried fruit, tough cuts of meat, nuts, and seeds.  Take a vitamin and mineral supplement as told by your health care provider or dietitian. This information is not intended to replace advice given to you by your health care provider. Make sure you discuss any questions you have with your health care provider. Document Revised: 07/23/2018 Document Reviewed: 06/03/2016 Elsevier Patient Education  2020 [employer].

## 2019-05-04 ENCOUNTER — Other Ambulatory Visit: Payer: Self-pay | Admitting: Family Medicine

## 2019-05-04 DIAGNOSIS — E785 Hyperlipidemia, unspecified: Secondary | ICD-10-CM

## 2019-05-09 MED ORDER — ATORVASTATIN CALCIUM 40 MG PO TABS: 40.0000 mg | ORAL_TABLET | Freq: Every day | ORAL | 0 refills | Status: DC

## 2019-05-11 ENCOUNTER — Ambulatory Visit (AMBULATORY_SURGERY_CENTER): Payer: BC Managed Care – PPO | Admitting: *Deleted

## 2019-05-11 ENCOUNTER — Other Ambulatory Visit: Payer: Self-pay

## 2019-05-11 VITALS — Temp 97.4°F | Ht 67.0 in | Wt 210.0 lb

## 2019-05-11 DIAGNOSIS — K5732 Diverticulitis of large intestine without perforation or abscess without bleeding: Secondary | ICD-10-CM

## 2019-05-11 NOTE — Progress Notes (Signed)
Pt is aware that care partner will wait in the car during procedure; if they feel like they will be too hot or cold to wait in the car; they may wait in the 4 th floor lobby. Patient is aware to bring only one care partner. We want them to wear a mask (we do not have any that we can provide them), practice social distancing, and we will check their temperatures when they get here.  I did remind the patient that their care partner needs to stay in the parking lot the entire time and have a cell phone available, we will call them when the pt is ready for discharge. Patient will wear mask into building.  No egg or soy allergy  No home oxygen use or problems with anesthesia  No medications for weight loss taken  emmi information given  covid test 06-02-19 at 11:20   Pt had recent diverticulitis incident.  I spoke with Dr. Carlean Purl, who reviewed pt's chart.  He states pt is ok to have colonoscopy in late February.  Appt made for 06-07-19.  Pt is instructed to call office if he has any recurrent pain.  Understanding voiced

## 2019-05-16 DIAGNOSIS — K635 Polyp of colon: Secondary | ICD-10-CM

## 2019-05-16 HISTORY — DX: Polyp of colon: K63.5

## 2019-05-25 ENCOUNTER — Encounter: Payer: BC Managed Care – PPO | Admitting: Internal Medicine

## 2019-06-02 ENCOUNTER — Other Ambulatory Visit: Payer: Self-pay | Admitting: Internal Medicine

## 2019-06-02 DIAGNOSIS — Z1159 Encounter for screening for other viral diseases: Secondary | ICD-10-CM | POA: Diagnosis not present

## 2019-06-03 ENCOUNTER — Encounter: Payer: Self-pay | Admitting: Internal Medicine

## 2019-06-03 LAB — SARS CORONAVIRUS 2 (TAT 6-24 HRS): SARS Coronavirus 2: NEGATIVE

## 2019-06-07 ENCOUNTER — Other Ambulatory Visit: Payer: Self-pay

## 2019-06-07 ENCOUNTER — Ambulatory Visit (AMBULATORY_SURGERY_CENTER): Payer: BC Managed Care – PPO | Admitting: Internal Medicine

## 2019-06-07 ENCOUNTER — Encounter: Payer: Self-pay | Admitting: Internal Medicine

## 2019-06-07 VITALS — BP 98/66 | HR 65 | Temp 97.1°F | Resp 17 | Ht 67.0 in | Wt 210.0 lb

## 2019-06-07 DIAGNOSIS — K635 Polyp of colon: Secondary | ICD-10-CM | POA: Diagnosis not present

## 2019-06-07 DIAGNOSIS — Z1211 Encounter for screening for malignant neoplasm of colon: Secondary | ICD-10-CM

## 2019-06-07 DIAGNOSIS — K5732 Diverticulitis of large intestine without perforation or abscess without bleeding: Secondary | ICD-10-CM | POA: Diagnosis not present

## 2019-06-07 DIAGNOSIS — D125 Benign neoplasm of sigmoid colon: Secondary | ICD-10-CM | POA: Diagnosis not present

## 2019-06-07 MED ORDER — SODIUM CHLORIDE 0.9 % IV SOLN: 500.0000 mL | Freq: Once | INTRAVENOUS | Status: DC

## 2019-06-07 MED ORDER — SULFAMETHOXAZOLE-TRIMETHOPRIM 800-160 MG PO TABS: 1.0000 | ORAL_TABLET | Freq: Two times a day (BID) | ORAL | 0 refills | Status: AC

## 2019-06-07 MED ORDER — METRONIDAZOLE 500 MG PO TABS: 500.0000 mg | ORAL_TABLET | Freq: Three times a day (TID) | ORAL | 0 refills | Status: AC

## 2019-06-07 NOTE — Progress Notes (Signed)
Called to room to assist during endoscopic procedure.  Patient ID and intended procedure confirmed with present staff. Received instructions for my participation in the procedure from the performing physician.  

## 2019-06-07 NOTE — Patient Instructions (Addendum)
I found and removed one tiny polyp that looks benign.  I saw some inflammation consistent with diverticulitis also so will treat with antibiotics again - x 10 days. Trimethoprim-sulfa DS and metronidazole. When I prescribed them it said they were not covered on the prescription plan but they are generic and should not cost much money without insurance coverage.  I will contact you about the polyp results and also to see if you need follow-up with another CT scan after the antibiotics are finished. It is my understanding that the surgeon wants to see you in follow-up after this is complete. Will contact them about the results as well.  I appreciate the opportunity to care for you. Gatha Mayer, MD, [facility]  Polyp handout given to patient. Diverticulosis/Diverticulitis handout given to patient.  Continue present medications. Soft diet. Treat Diverticulisits with Septra D, one table 2 times daily, and Metronidazoole 500 mg, one tablet 3 times daily for 10 days.  YOU HAD AN ENDOSCOPIC PROCEDURE TODAY AT [facility]:   Refer to the procedure report that was given to you for any specific questions about what was found during the examination.  If the procedure report does not answer your questions, please call your gastroenterologist to clarify.  If you requested that your care partner not be given the details of your procedure findings, then the procedure report has been included in a sealed envelope for you to review at your convenience later.  YOU SHOULD EXPECT: Some feelings of bloating in the abdomen. Passage of more gas than usual.  Walking can help get rid of the air that was put into your GI tract during the procedure and reduce the bloating. If you had a lower endoscopy (such as a colonoscopy or flexible sigmoidoscopy) you may notice spotting of blood in your stool or on the toilet paper. If you underwent a bowel prep for your procedure, you may not have a normal bowel  movement for a few days.  Please Note:  You might notice some irritation and congestion in your nose or some drainage.  This is from the oxygen used during your procedure.  There is no need for concern and it should clear up in a day or so.  SYMPTOMS TO REPORT IMMEDIATELY:   Following lower endoscopy (colonoscopy or flexible sigmoidoscopy):  Excessive amounts of blood in the stool  Significant tenderness or worsening of abdominal pains  Swelling of the abdomen that is new, acute  Fever of 100F or higher  For urgent or emergent issues, a gastroenterologist can be reached at any hour by calling [phone].   DIET:  We do recommend a small meal at first, but then you may proceed to your regular diet.  Drink plenty of fluids but you should avoid alcoholic beverages for 24 hours.  ACTIVITY:  You should plan to take it easy for the rest of today and you should NOT DRIVE or use heavy machinery until tomorrow (because of the sedation medicines used during the test).    FOLLOW UP: Our staff will call the number listed on your records 48-72 hours following your procedure to check on you and address any questions or concerns that you may have regarding the information given to you following your procedure. If we do not reach you, we will leave a message.  We will attempt to reach you two times.  During this call, we will ask if you have developed any symptoms of COVID 19. If you develop any  symptoms (ie: fever, flu-like symptoms, shortness of breath, cough etc.) before then, please call [phone].  If you test positive for Covid 19 in the 2 weeks post procedure, please call and report this information to [place].    If any biopsies were taken you will be contacted by phone or by letter within the next 1-3 weeks.  Please call us at [phone] if you have not heard about the biopsies in 3 weeks.    SIGNATURES/CONFIDENTIALITY: You and/or your care partner have signed paperwork which will be  entered into your electronic medical record.  These signatures attest to the fact that that the information above on your After Visit Summary has been reviewed and is understood.  Full responsibility of the confidentiality of this discharge information lies with you and/or your care-partner.

## 2019-06-07 NOTE — Progress Notes (Signed)
Temp by JB, Vitals by DT   Pt's states no medical or surgical changes since previsit or office visit. 

## 2019-06-07 NOTE — Progress Notes (Signed)
Pt tolerated well. VSS. Awake and to recovery. 

## 2019-06-07 NOTE — Op Note (Signed)
[facility] Patient Name: Alexander Gentry Procedure Date: 06/07/2019 9:50 AM MRN: [id number] Endoscopist: Gatha Mayer , MD Age: 49 Referring MD:  Date of Birth: February 18, 1971 Gender: Male Account #: [id number] Procedure:                Colonoscopy Indications:              Diverticulitis, Follow-up of diverticulitis Medicines:                Propofol per Anesthesia, Monitored Anesthesia Care Procedure:                Pre-Anesthesia Assessment:                           - Prior to the procedure, a History and Physical                            was performed, and patient medications and                            allergies were reviewed. The patient's tolerance of                            previous anesthesia was also reviewed. The risks                            and benefits of the procedure and the sedation                            options and risks were discussed with the patient.                            All questions were answered, and informed consent                            was obtained. Prior Anticoagulants: The patient has                            taken no previous anticoagulant or antiplatelet                            agents. ASA Grade Assessment: II - A patient with                            mild systemic disease. After reviewing the risks                            and benefits, the patient was deemed in                            satisfactory condition to undergo the procedure.                           After obtaining informed consent, the colonoscope  was passed under direct vision. Throughout the                            procedure, the patient's blood pressure, pulse, and                            oxygen saturations were monitored continuously. The                            Colonoscope was introduced through the anus and                            advanced to the the cecum, identified by   appendiceal orifice and ileocecal valve. The                            colonoscopy was performed without difficulty. The                            patient tolerated the procedure well. The quality                            of the bowel preparation was good. The ileocecal                            valve, appendiceal orifice, and rectum were                            photographed. The bowel preparation used was                            Miralax via split dose instruction. Scope In: 9:59:00 AM Scope Out: 10:13:20 AM Scope Withdrawal Time: 0 hours 11 minutes 35 seconds  Total Procedure Duration: 0 hours 14 minutes 20 seconds  Findings:                 The perianal and digital rectal examinations were                            normal. Pertinent negatives include normal prostate                            (size, shape, and consistency).                           A 4 mm polyp was found in the distal sigmoid colon.                            The polyp was flat. The polyp was removed with a                            cold snare. Resection and retrieval were complete.  Verification of patient identification for the                            specimen was done. Estimated blood loss was minimal.                           Multiple small and large-mouthed diverticula were                            found in the sigmoid colon. Purulent discharge was                            seen in association with the diverticular opening,                            suspicious of diverticulitis.                           The exam was otherwise without abnormality on                            direct and retroflexion views. Complications:            No immediate complications. Estimated Blood Loss:     Estimated blood loss was minimal. Impression:               - One 4 mm polyp in the distal sigmoid colon,                            removed with a cold snare. Resected and retrieved.                            - Diverticulosis in the sigmoid colon. Purulent                            discharge was seen in association with the                            diverticular opening, suspicious of diverticulitis.                           - The examination was otherwise normal on direct                            and retroflexion views. Recommendation:           - Patient has a contact number available for                            emergencies. The signs and symptoms of potential                            delayed complications were discussed with the  patient. Return to normal activities tomorrow.                            Written discharge instructions were provided to the                            patient.                           - Soft diet.                           - Continue present medications.                           - Repeat colonoscopy is recommended. The                            colonoscopy date will be determined after pathology                            results from today's exam become available for                            review.                           - TREAT SUSPECTED DIVERTICULITIS WITH SEPTRA DS BID                            AND METRONIDAZOLE 500 MG TID X 10 DAYS                           WILL CONTACT HIM WITH PATH RESULTS AND REASSESS -                            CONSIDER CT SCANNING TO DOCUMENT RESOLUTION OF THE                            DIVERTICULITIS - HE HAD MICROPERFORATION IN JANUARY                           HE IS TO F/U WITH [facility] SO                            WILL COORDINATE WITH THEM AS WELL Gatha Mayer, MD 06/07/2019 10:28:21 AM This report has been signed electronically.

## 2019-06-09 ENCOUNTER — Telehealth: Payer: Self-pay

## 2019-06-09 NOTE — Telephone Encounter (Signed)
  Follow up Call-  Call back number 06/07/2019  Post procedure Call Back phone  # [phone]  Permission to leave phone message Yes  Some recent data might be hidden     Patient questions:  Do you have a fever, pain , or abdominal swelling? No. Pain Score  0 *  Have you tolerated food without any problems? Yes.    Have you been able to return to your normal activities? Yes.    Do you have any questions about your discharge instructions: Diet   No. Medications  No. Follow up visit  No.  Do you have questions or concerns about your Care? No.  Actions: * If pain score is 4 or above: No action needed, pain <4. 1. Have you developed a fever since your procedure? no  2.   Have you had an respiratory symptoms (SOB or cough) since your procedure? no  3.   Have you tested positive for COVID 19 since your procedure no  4.   Have you had any family members/close contacts diagnosed with the COVID 19 since your procedure?  no   If yes to any of these questions please route to Joylene John, RN and Alphonsa Gin, Therapist, sports.

## 2019-06-14 ENCOUNTER — Encounter: Payer: Self-pay | Admitting: Family Medicine

## 2019-06-15 ENCOUNTER — Telehealth: Payer: Self-pay | Admitting: Internal Medicine

## 2019-06-15 ENCOUNTER — Telehealth: Payer: Self-pay

## 2019-06-15 NOTE — Telephone Encounter (Signed)
Left message to please call back. °

## 2019-06-15 NOTE — Telephone Encounter (Signed)
Spoke to the patient who reported nausea with antibiotic administration. He admitted to sometimes not taking the medication with a full meal. I told the patient since he only had 2 or 3 days left to take the remaining medication with full meals. Patient verbalized understanding and will complete medication.

## 2019-06-15 NOTE — Telephone Encounter (Signed)
Pt states that antibiotic that he was put on are giving him headaches and nausea. He wants to know if he can take something different because he still has few more days that he needs to take them.

## 2019-07-04 DIAGNOSIS — Z8719 Personal history of other diseases of the digestive system: Secondary | ICD-10-CM | POA: Diagnosis not present

## 2019-11-17 ENCOUNTER — Other Ambulatory Visit: Payer: Self-pay | Admitting: Family Medicine

## 2020-01-01 ENCOUNTER — Other Ambulatory Visit: Payer: Self-pay | Admitting: Family Medicine

## 2020-01-01 DIAGNOSIS — E785 Hyperlipidemia, unspecified: Secondary | ICD-10-CM

## 2020-01-02 MED ORDER — ATORVASTATIN CALCIUM 40 MG PO TABS: 40.0000 mg | ORAL_TABLET | Freq: Every day | ORAL | 0 refills | Status: DC

## 2020-03-27 ENCOUNTER — Encounter: Payer: Self-pay | Admitting: Family Medicine

## 2020-03-28 MED ORDER — HYDROCHLOROTHIAZIDE 25 MG PO TABS: 12.5000 mg | ORAL_TABLET | Freq: Every day | ORAL | 0 refills | Status: DC

## 2020-07-21 ENCOUNTER — Other Ambulatory Visit: Payer: Self-pay | Admitting: Family Medicine

## 2020-07-21 DIAGNOSIS — E785 Hyperlipidemia, unspecified: Secondary | ICD-10-CM

## 2020-07-23 MED ORDER — ATORVASTATIN CALCIUM 40 MG PO TABS: 40.0000 mg | ORAL_TABLET | Freq: Every day | ORAL | 0 refills | Status: DC

## 2020-08-20 ENCOUNTER — Encounter: Payer: Self-pay | Admitting: Family Medicine

## 2020-09-06 ENCOUNTER — Encounter: Payer: Self-pay | Admitting: Family Medicine

## 2020-09-06 ENCOUNTER — Other Ambulatory Visit: Payer: Self-pay

## 2020-09-06 ENCOUNTER — Ambulatory Visit (INDEPENDENT_AMBULATORY_CARE_PROVIDER_SITE_OTHER): Payer: 59 | Admitting: Family Medicine

## 2020-09-06 VITALS — BP 134/87 | HR 74 | Ht 67.0 in | Wt 230.6 lb

## 2020-09-06 DIAGNOSIS — E785 Hyperlipidemia, unspecified: Secondary | ICD-10-CM | POA: Diagnosis not present

## 2020-09-06 DIAGNOSIS — I10 Essential (primary) hypertension: Secondary | ICD-10-CM

## 2020-09-06 DIAGNOSIS — Z79899 Other long term (current) drug therapy: Secondary | ICD-10-CM

## 2020-09-06 DIAGNOSIS — Z23 Encounter for immunization: Secondary | ICD-10-CM | POA: Diagnosis not present

## 2020-09-06 DIAGNOSIS — Z6833 Body mass index (BMI) 33.0-33.9, adult: Secondary | ICD-10-CM

## 2020-09-06 DIAGNOSIS — Z131 Encounter for screening for diabetes mellitus: Secondary | ICD-10-CM

## 2020-09-06 DIAGNOSIS — K219 Gastro-esophageal reflux disease without esophagitis: Secondary | ICD-10-CM

## 2020-09-06 DIAGNOSIS — E669 Obesity, unspecified: Secondary | ICD-10-CM

## 2020-09-06 LAB — POCT GLYCOSYLATED HEMOGLOBIN (HGB A1C): Hemoglobin A1C: 5.4 % (ref 4.0–5.6)

## 2020-09-06 MED ORDER — OMEPRAZOLE 20 MG PO CPDR: 20.0000 mg | DELAYED_RELEASE_CAPSULE | Freq: Every day | ORAL | 3 refills | Status: DC

## 2020-09-06 MED ORDER — HYDROCHLOROTHIAZIDE 25 MG PO TABS
25.0000 mg | ORAL_TABLET | Freq: Every day | ORAL | 0 refills | Status: DC
Start: 2020-09-06 — End: 2020-09-26

## 2020-09-06 NOTE — Patient Instructions (Addendum)
Increase Hydrochlorothiazide to a whole tablet (25 mg) daily  Consider trying Prilosec (omeprazole 20 mg daily before the largest meal of the day when you are having frequent heartburn. When you do take the Prilosec (omepraxole) take it for 2 weeks in a row.   We are checking your cholesterol today to see how welll the atorvastatin is working to lower it.   Stop the aspirin  Check with your insurance to see how much they will cover your Shingles vaccination (Shingrix).  You may get thhe vaccination at your pharmacy without a doctor's prescription.   Due to healthcare laws, you may see the results of your imaging and laboratory studies on MyChart before your provider has had a chance to review them.   Your doctors understand that in some cases there may be results that are confusing or concerning to you.   Another cause of confusion is that all your laboratory results may not show up at the same time.   Your doctor may be waiting for multiple results to finally show up before your doctor can dicuss the results with you.     Please give [place] 72 hours in order for your doctor to thoroughly review all the results before contacting the office for clarification of your results.  If everything is normal, you will get a letter in the mail or a message in My Chart.  If there is a test or imaging study that is concerning to your doctor, they will call you to discuss them.   Please give [place] a call if you do not hear from [place] after 2 weeks.

## 2020-09-07 ENCOUNTER — Encounter: Payer: Self-pay | Admitting: Family Medicine

## 2020-09-07 DIAGNOSIS — K219 Gastro-esophageal reflux disease without esophagitis: Secondary | ICD-10-CM | POA: Insufficient documentation

## 2020-09-07 LAB — CMP14+EGFR
ALT: 36 [IU]/L (ref 0–44)
AST: 21 [IU]/L (ref 0–40)
Albumin/Globulin Ratio: 1.5 (ref 1.2–2.2)
Albumin: 4.2 g/dL (ref 4.0–5.0)
Alkaline Phosphatase: 130 [IU]/L — ABNORMAL HIGH (ref 44–121)
BUN/Creatinine Ratio: 13 (ref 9–20)
BUN: 13 mg/dL (ref 6–24)
Bilirubin Total: 0.4 mg/dL (ref 0.0–1.2)
CO2: 23 mmol/L (ref 20–29)
Calcium: 9.1 mg/dL (ref 8.7–10.2)
Chloride: 100 mmol/L (ref 96–106)
Creatinine, Ser: 1 mg/dL (ref 0.76–1.27)
Globulin, Total: 2.8 g/dL (ref 1.5–4.5)
Glucose: 85 mg/dL (ref 65–99)
Potassium: 4.1 mmol/L (ref 3.5–5.2)
Sodium: 142 mmol/L (ref 134–144)
Total Protein: 7 g/dL (ref 6.0–8.5)
eGFR: 92 mL/min/{1.73_m2}

## 2020-09-07 LAB — LIPID PANEL
Chol/HDL Ratio: 3 ratio (ref 0.0–5.0)
Cholesterol, Total: 161 mg/dL (ref 100–199)
HDL: 54 mg/dL
LDL Chol Calc (NIH): 97 mg/dL (ref 0–99)
Triglycerides: 47 mg/dL (ref 0–149)
VLDL Cholesterol Cal: 10 mg/dL (ref 5–40)

## 2020-09-07 NOTE — Assessment & Plan Note (Signed)
Established problem LDL 97 - At goal on atorvastatin 40 mg daily. Well Controlled. No signs of complications, medication side effects, or red flags. Continue current medications and other regiments.

## 2020-09-07 NOTE — Assessment & Plan Note (Addendum)
Established problem. Comprehensive Metabolic Panel:    Component Value Date/Time   NA 142 09/06/2020 1214   K 4.1 09/06/2020 1214   CL 100 09/06/2020 1214   CO2 23 09/06/2020 1214   BUN 13 09/06/2020 1214   CREATININE 1.00 09/06/2020 1214   CREATININE 1.03 12/20/2015 1222   GLUCOSE 85 09/06/2020 1214   GLUCOSE 79 04/25/2019 0123   CALCIUM 9.1 09/06/2020 1214   AST 21 09/06/2020 1214   ALT 36 09/06/2020 1214   ALKPHOS 130 (H) 09/06/2020 1214   BILITOT 0.4 09/06/2020 1214   PROT 7.0 09/06/2020 1214   ALBUMIN 4.2 09/06/2020 1214     Adequate blood pressure control.  No evidence of new end organ damage.  Tolerating medication without significant adverse effects.  Plan to continue current blood pressure medication regiment.   Recommended Mr Ormond stop his daily Aspirin as he does not require secondary prevention therapy.

## 2020-09-07 NOTE — Progress Notes (Signed)
Fritz Cauthon is alone Sources of clinical information for visit is/are patient and past medical records. Nursing assessment for this office visit was reviewed with the patient for accuracy and revision.     Previous Report(s) Reviewed: lab reports and office notes  Depression screen [facility] 2/9 09/06/2020  Decreased Interest 0  Down, Depressed, Hopeless 0  PHQ - 2 Score 0  Altered sleeping 0  Tired, decreased energy 0  Change in appetite 0  Feeling bad or failure about yourself  0  Trouble concentrating 0  Moving slowly or fidgety/restless 0  Suicidal thoughts 0  PHQ-9 Score 0    No flowsheet data found.  PHQ9 SCORE ONLY 09/06/2020 04/23/2017 12/20/2015  PHQ-9 Total Score 0 0 0    Adult vaccines due  Topic Date Due  . TETANUS/TDAP  09/07/2030    Health Maintenance Due  Topic Date Due  . Zoster Vaccines- Shingrix (1 of 2) Never done      History/P.E. limitations: none  Adult vaccines due  Topic Date Due  . TETANUS/TDAP  09/07/2030   There are no preventive care reminders to display for this patient.  Health Maintenance Due  Topic Date Due  . Zoster Vaccines- Shingrix (1 of 2) Never done     Chief Complaint  Patient presents with  . Annual Exam

## 2020-09-07 NOTE — Assessment & Plan Note (Addendum)
Established problem worsened.  Alexander Gentry and his wife are starting OPTIFAST program.   His A1c is 5.4% - not in diabetes nor pre-diabetes range.  Monitor periodically.

## 2020-09-07 NOTE — Assessment & Plan Note (Signed)
New complaint No red flags Patient recognizes that the reflux/indigestion may be related to his weight gain Since the indigestion is not responsive to OTC H2B, recommend a trial of PPI, omeprazole 20 mg (RX or OTC) daily x 2weeks.  May use TUMS or Rolaids in addition.  Alexander Gentry is working on his weight.

## 2020-09-26 ENCOUNTER — Other Ambulatory Visit: Payer: Self-pay | Admitting: Family Medicine

## 2020-09-26 DIAGNOSIS — I10 Essential (primary) hypertension: Secondary | ICD-10-CM

## 2020-10-13 ENCOUNTER — Other Ambulatory Visit: Payer: Self-pay | Admitting: Family Medicine

## 2020-10-13 DIAGNOSIS — I10 Essential (primary) hypertension: Secondary | ICD-10-CM

## 2020-10-27 ENCOUNTER — Other Ambulatory Visit: Payer: Self-pay | Admitting: Family Medicine

## 2020-10-27 DIAGNOSIS — E785 Hyperlipidemia, unspecified: Secondary | ICD-10-CM

## 2021-01-11 IMAGING — CT CT ABD-PELV W/ CM
3 of 5 series · 16 of 46 positions shown, 18 images · IV contrast (APPLIED)
Comparison: None.

CLINICAL DATA: Right lower quadrant pain

EXAM:
CT ABDOMEN AND PELVIS WITH CONTRAST
TECHNIQUE: Multidetector CT imaging of the abdomen and pelvis was performed
using the standard protocol following bolus administration of
intravenous contrast.
CONTRAST:  100mL OMNIPAQUE IOHEXOL 300 MG/ML  SOLN

[Series 3: abdomen 5.0 · axial · 0.81mm/px · z∈[+872,+1262]mm · 11 of 94 slices shown, 13 images]
[im 8/94  soft-tissue]
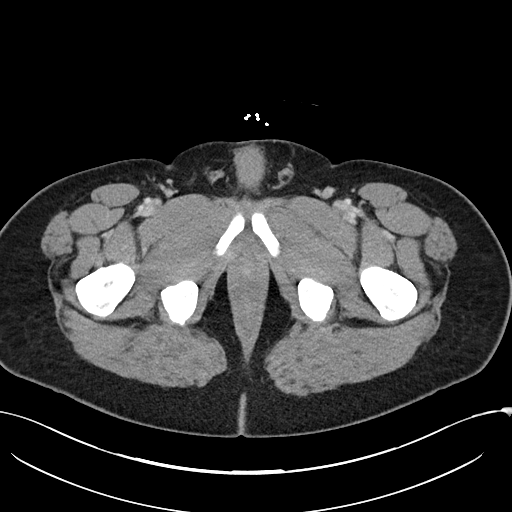
[im 8/94  bone]
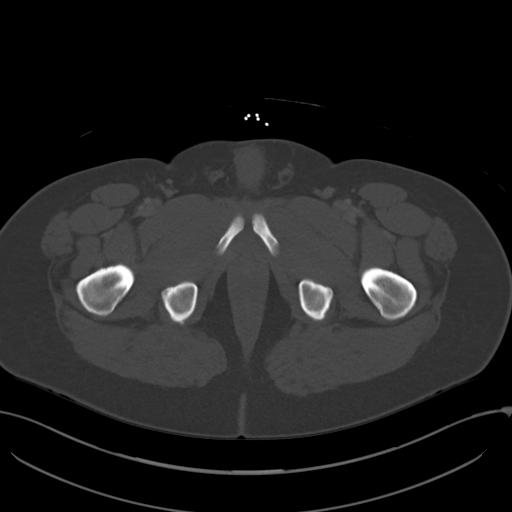
[im 16/94  soft-tissue]
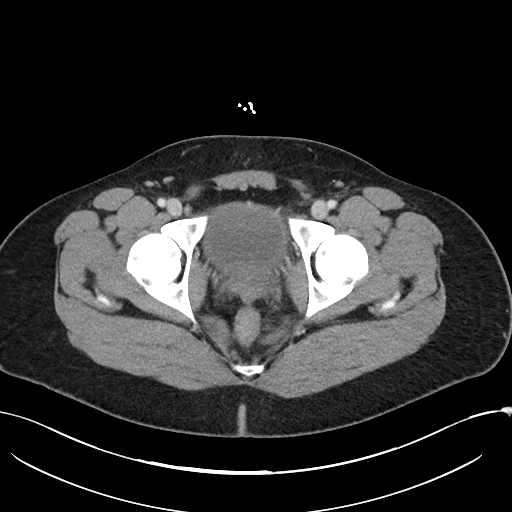
[im 24/94  soft-tissue]
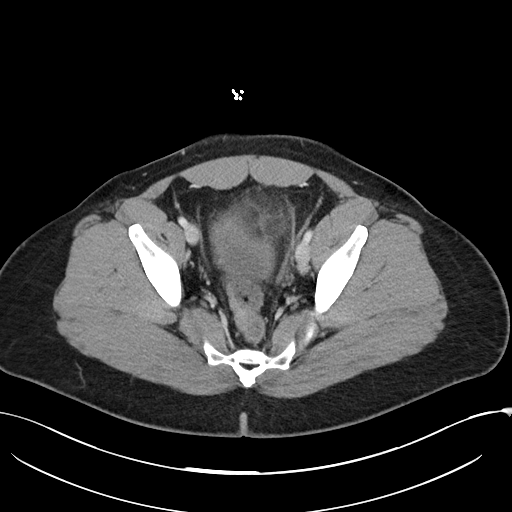
[im 32/94  soft-tissue]
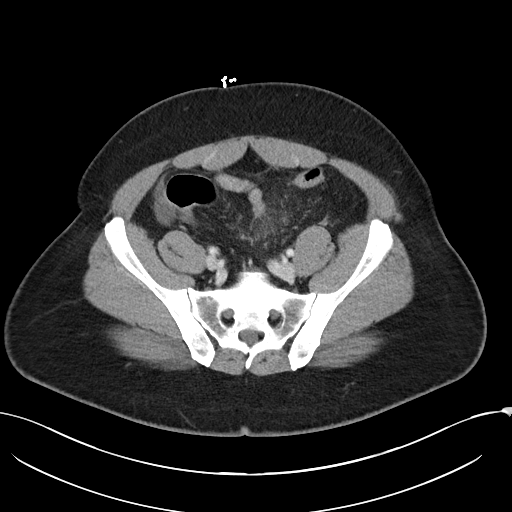
[im 39/94  soft-tissue]
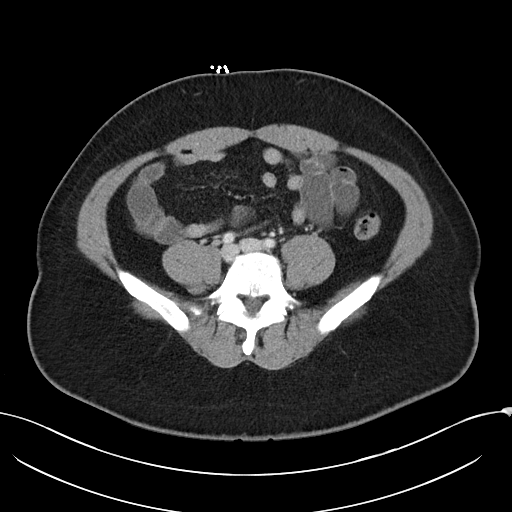
[im 47/94  soft-tissue]
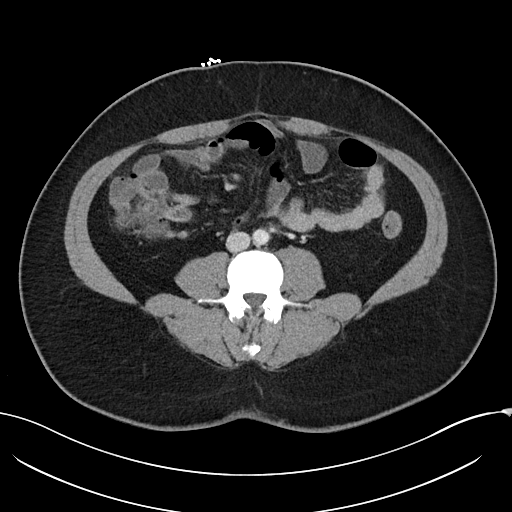
[im 55/94  soft-tissue]
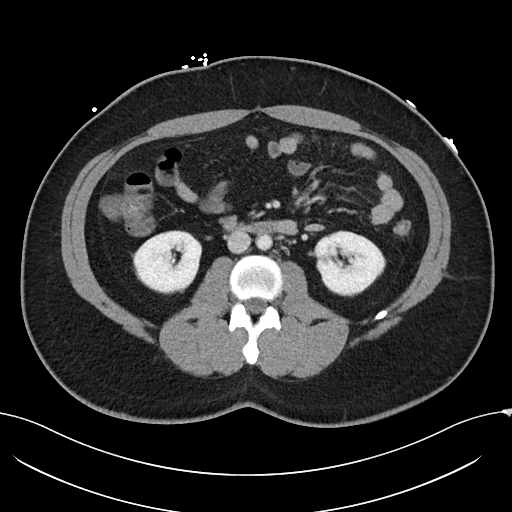
[im 63/94  soft-tissue]
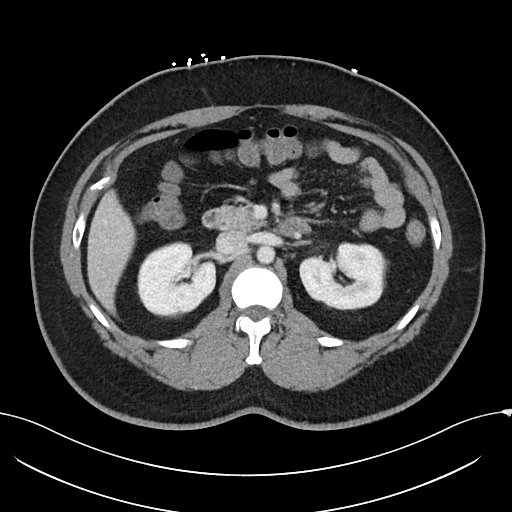
[im 70/94  soft-tissue]
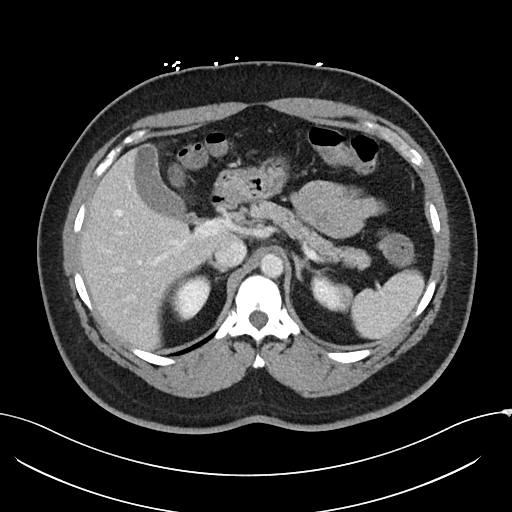
[im 70/94  bone]
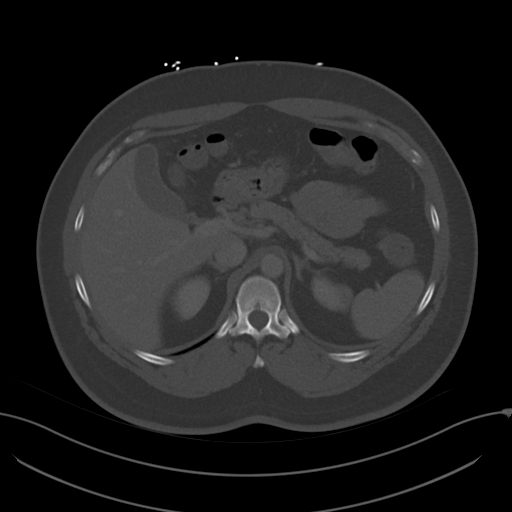
[im 78/94  soft-tissue]
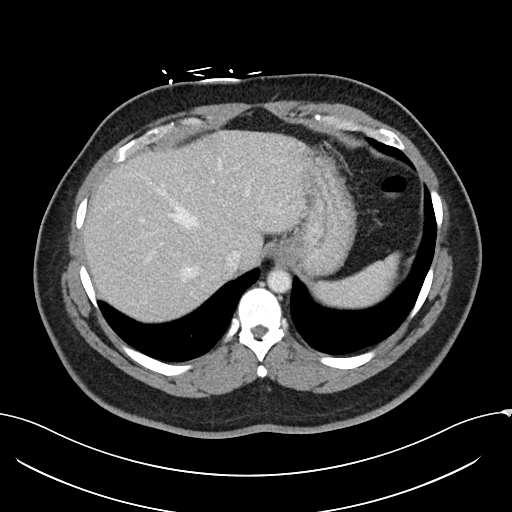
[im 86/94  soft-tissue]
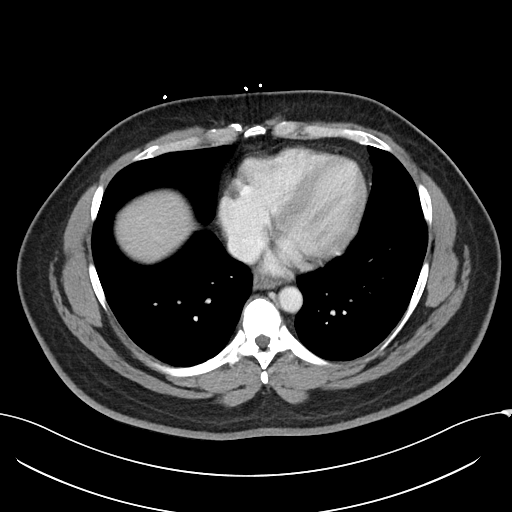

[Series 5: lung · axial · 0.81mm/px · z∈[+1108,+1138]mm · 2 of 105 slices shown]
[im 8/105  bone]
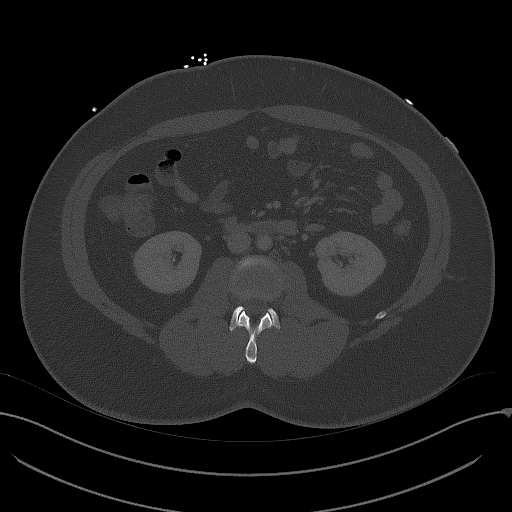
[im 23/105  bone]
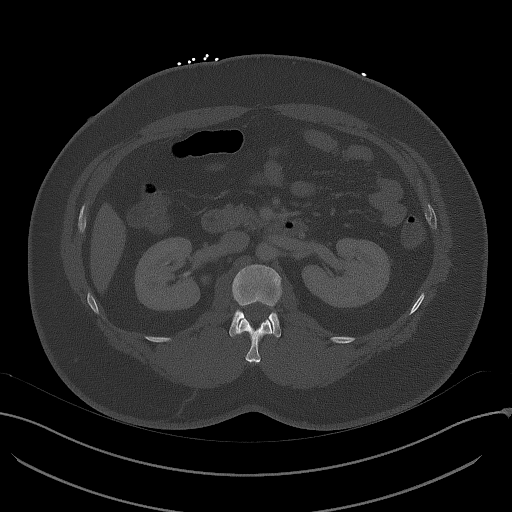

[Series 6: abdomen 3.0 mpr cor · coronal · 0.74mm/px · 3 of 97 slices shown]
[im 33/97  soft-tissue]
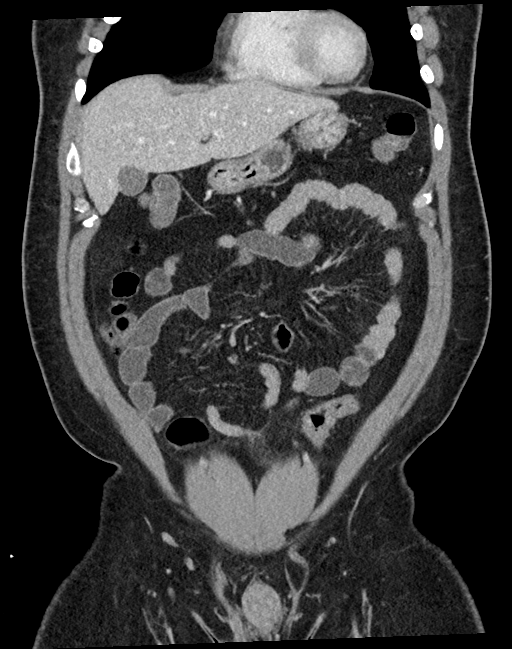
[im 43/97  soft-tissue]
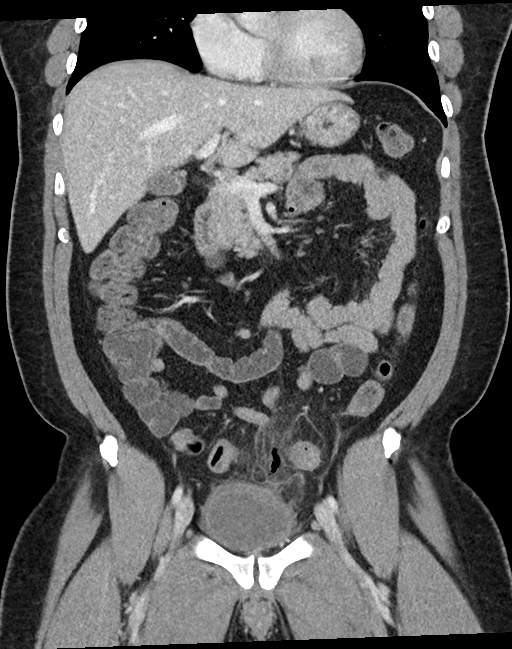
[im 54/97  soft-tissue]
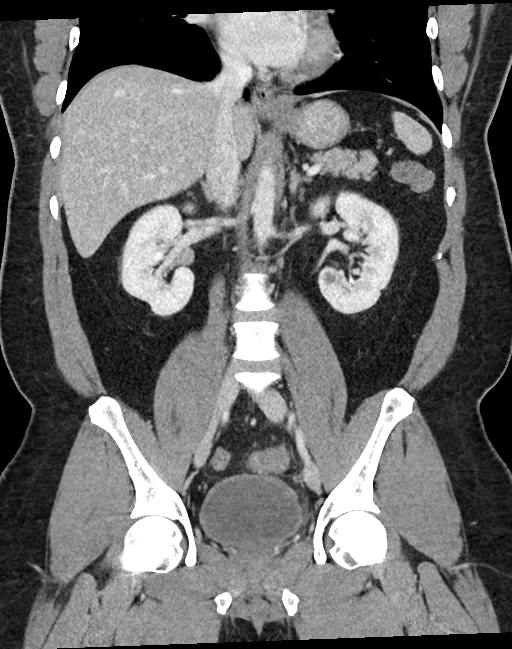

[16 of 46 positions shown; findings below may reference images not displayed]

FINDINGS: Lower chest: Lung bases are clear. No effusions. Heart is normal
size.

Hepatobiliary: No focal hepatic abnormality. Gallbladder
unremarkable.

Pancreas: No focal abnormality or ductal dilatation.

Spleen: No focal abnormality.  Normal size.

Adrenals/Urinary Tract: Small bilateral renal cysts. No renal or
ureteral stones. No hydronephrosis. Adrenal glands and urinary
bladder unremarkable.

Stomach/Bowel: Sigmoid diverticulosis. Inflammatory stranding around
the sigmoid colon compatible with active diverticulitis. There is a
small extraluminal gas collection adjacent to the sigmoid colon
compatible with micro perforation.

Vascular/Lymphatic: No evidence of aneurysm or adenopathy.

Reproductive: No visible focal abnormality.

Other: No free fluid or free air.

Musculoskeletal: No acute bony abnormality.
IMPRESSION: Sigmoid diverticulosis with associated acute diverticulitis changes.
Small extraluminal gas collection adjacent to the sigmoid colon
compatible with microperforation.

## 2021-01-16 ENCOUNTER — Other Ambulatory Visit: Payer: Self-pay | Admitting: Family Medicine

## 2021-01-16 DIAGNOSIS — K219 Gastro-esophageal reflux disease without esophagitis: Secondary | ICD-10-CM

## 2021-03-11 ENCOUNTER — Encounter (HOSPITAL_COMMUNITY): Payer: Self-pay

## 2021-03-11 ENCOUNTER — Other Ambulatory Visit: Payer: Self-pay

## 2021-03-11 ENCOUNTER — Ambulatory Visit (HOSPITAL_COMMUNITY)
Admission: EM | Admit: 2021-03-11 | Discharge: 2021-03-11 | Disposition: A | Discharge status: Home / Self Care | Payer: 59 | Attending: Emergency Medicine | Admitting: Emergency Medicine

## 2021-03-11 DIAGNOSIS — R519 Headache, unspecified: Secondary | ICD-10-CM

## 2021-03-11 DIAGNOSIS — I10 Essential (primary) hypertension: Secondary | ICD-10-CM

## 2021-03-11 MED ORDER — SUMATRIPTAN SUCCINATE 50 MG PO TABS: 50.0000 mg | ORAL_TABLET | ORAL | 0 refills | Status: DC | PRN

## 2021-03-11 MED ORDER — ONDANSETRON HCL 4 MG PO TABS: 4.0000 mg | ORAL_TABLET | Freq: Three times a day (TID) | ORAL | 0 refills | Status: DC | PRN

## 2021-03-11 NOTE — Discharge Instructions (Addendum)
Talk with Dr. McDiarmid about your elevated blood pressure readings - you may need more/different medicine to control your blood pressure.   Talk with Dr. McDiarmid about your headaches.  Try to keep a headache diary to identify triggers.

## 2021-03-11 NOTE — ED Triage Notes (Signed)
Pt presents with elevated blood pressure with headache X 3 days.

## 2021-03-12 NOTE — ED Provider Notes (Signed)
[place]    CSN: [id number] Arrival date & time: 03/11/21  1042      History   Chief Complaint Chief Complaint  Patient presents with   Hypertension   Headache    HPI Alexander Gentry is a 50 y.o. male. He reports ~ 2 months of headache episodes. Headaches are getting progressively stronger and more frequent. Initially headaches occurred about 1-2x/week, now happening more often but not daily.  In the past few days, he has been checking his BP when he has a severe headache and found it to be high up to 190's/120's).  When pt does not have headache, BP is ~140-150/90's.  Take HCTZ daily.   Can typically feel when headache is coming on. Takes Excedrin which sometimes relieves headache but not always. Headache is typically behind L eye and on L forehead, L side of head.  Vision is not affected but eye can become bloodshot. Headaches associated with nausea, typically no vomiting but did have emesis x1 with headache 2 days ago.  Headaches can last up to an hour or so. Wife reports pt is not confused or altered with headaches. Pt denies weakness, numbness, change in function with headaches.   Pt does not have hx of headaches or migraines. Does report increased stress in last 2 months as father has pancreatic cancer and has needed increased care and is also declining in health.    Hypertension Associated symptoms include headaches.  Headache Associated symptoms: nausea and vomiting   Associated symptoms: no dizziness, no numbness and no weakness    Past Medical History:  Diagnosis Date   Chronic pain of left knee 04/23/2017   Chronic pain of right knee 11/17/2013   S/P ACL and meniscectomy on right knee in 2014.    Diverticulitis    Elevated transaminase level 12/07/2012   HYPERLIPIDEMIA 05/13/2007   Hyperplastic colon polyp 05/2019   Screening colonoscopy by Dr Carlean Purl (GI) - Follow up in 10 years recommended.    Hypertension 11/2010   OBESITY 05/13/2007   ROTATOR CUFF  INJURY, RIGHT SHOULDER 05/24/2009    Patient Active Problem List   Diagnosis Date Noted   Gastroesophageal reflux disease 09/07/2020   Perforation of sigmoid colon due to diverticulitis 04/24/2019   Chronic pain of left knee 04/23/2017   Chronic pain of right knee 11/17/2013   Hypertension goal BP (blood pressure) < 140/90 06/22/2008    Class: Chronic   Hyperlipidemia LDL goal <130 05/13/2007   OBESITY 05/13/2007    Past Surgical History:  Procedure Laterality Date   KNEE ARTHROSCOPY WITH ANTERIOR CRUCIATE LIGAMENT (ACL) REPAIR Right 2014   Dr Ivin Booty Manson [facility]sey, [facility]) Cadaveric ACL transplant   MENISCUS REPAIR Right 2014   Dr Ivin Booty Manson Passey, [facility])       Home Medications    Prior to Admission medications   Medication Sig Start Date End Date Taking? Authorizing Provider  ondansetron (ZOFRAN) 4 MG tablet Take 1 tablet (4 mg total) by mouth every 8 (eight) hours as needed for nausea or vomiting. 03/11/21  Yes Carvel Getting, NP  SUMAtriptan (IMITREX) 50 MG tablet Take 1 tablet (50 mg total) by mouth every 2 (two) hours as needed for migraine. May repeat in 2 hours if headache persists or recurs. 03/11/21  Yes Carvel Getting, NP  acetaminophen (TYLENOL) 325 MG tablet Take 2 tablets (650 mg total) by mouth every 6 (six) hours as needed for mild pain (or temp > 100). 04/27/19   Meuth, Blaine Hamper, PA-C  atorvastatin (LIPITOR) 40 MG tablet TAKE 1 TABLET BY MOUTH EVERY DAY 10/29/20   McDiarmid, Blane Ohara, MD  hydrochlorothiazide (HYDRODIURIL) 25 MG tablet TAKE 1 TABLET (25 MG TOTAL) BY MOUTH DAILY. 10/16/20   McDiarmid, Blane Ohara, MD  NON FORMULARY Immuneti immune defense 2 capsules daily    [provider]  omeprazole (PRILOSEC) 20 MG capsule TAKE 1 CAPSULE BY MOUTH EVERY DAY 01/16/21   McDiarmid, Blane Ohara, MD  Probiotic Product (PROBIOTIC PO) Take by mouth daily.    [provider]  Psyllium (METAMUCIL PO) Take by mouth daily.    [provider]    Family  History Family History  Problem Relation Age of Onset   Diabetes type II Maternal Uncle    Hypertension Mother    Colon cancer Neg Hx    Esophageal cancer Neg Hx    Rectal cancer Neg Hx    Stomach cancer Neg Hx     Social History Social History   Tobacco Use   Smoking status: Never   Smokeless tobacco: Never  Vaping Use   Vaping Use: Never used  Substance Use Topics   Alcohol use: Yes    Alcohol/week: 1.0 standard drink    Types: 1 Standard drinks or equivalent per week    Comment: socially   Drug use: No     Allergies   Penicillins   Review of Systems Review of Systems  Gastrointestinal:  Positive for nausea and vomiting.  Musculoskeletal:  Negative for gait problem.  Neurological:  Positive for headaches. Negative for dizziness, speech difficulty, weakness, light-headedness and numbness.    Physical Exam Triage Vital Signs ED Triage Vitals  Enc Vitals Group     BP 03/11/21 1306 (!) 137/94     Pulse Rate 03/11/21 1306 80     Resp 03/11/21 1306 17     Temp 03/11/21 1306 98.3 F (36.8 C)     Temp Source 03/11/21 1306 Oral     SpO2 03/11/21 1306 98 %     Weight --      Height --      Head Circumference --      Peak Flow --      Pain Score 03/11/21 1309 6     Pain Loc --      Pain Edu? --      Excl. in [place]? --    No data found.  Updated Vital Signs BP (!) 137/94 (BP Location: Right Arm)   Pulse 80   Temp 98.3 F (36.8 C) (Oral)   Resp 17   SpO2 98%   Visual Acuity Right Eye Distance:   Left Eye Distance:   Bilateral Distance:    Right Eye Near:   Left Eye Near:    Bilateral Near:     Physical Exam Constitutional:      General: He is not in acute distress.    Appearance: He is well-developed. He is not ill-appearing.  HENT:     Head: Normocephalic and atraumatic.  Eyes:     General: No visual field deficit.    Extraocular Movements:     Right eye: Normal extraocular motion and no nystagmus.     Left eye: Normal extraocular motion and  no nystagmus.     Pupils: Pupils are equal.     Right eye: Pupil is round and reactive.     Left eye: Pupil is round and reactive.  Cardiovascular:     Rate and Rhythm: Normal rate and regular rhythm.  Pulmonary:     Effort: Pulmonary effort is normal.     Breath sounds: Normal breath sounds.  Musculoskeletal:     Cervical back: No rigidity.  Neurological:     Mental Status: He is alert and oriented to person, place, and time.     Cranial Nerves: No cranial nerve deficit, dysarthria or facial asymmetry.     Motor: No weakness.     Coordination: Romberg sign negative. Coordination normal.     Gait: Gait normal.     UC Treatments / Results  Labs (all labs ordered are listed, but only abnormal results are displayed) Labs Reviewed - No data to display  EKG   Radiology No results found.  Procedures Procedures (including critical care time)  Medications Ordered in UC Medications - No data to display  Initial Impression / Assessment and Plan / UC Course  I have reviewed the triage vital signs and the nursing notes.  Pertinent labs & imaging results that were available during my care of the patient were reviewed by me and considered in my medical decision making (see chart for details).    Headaches may be migraines. Will trial sumatriptan + zofran for headaches. Pt to f/u with PCP to discuss both headaches and BP control - even when not in pain/no headache, BP seems to be above target based on BP readings pt shared.   Discussed reasons for seeking emergency care.   Final Clinical Impressions(s) / UC Diagnoses   Final diagnoses:  Acute nonintractable headache, unspecified headache type  Essential hypertension     Discharge Instructions      Talk with Dr. McDiarmid about your elevated blood pressure readings - you may need more/different medicine to control your blood pressure.   Talk with Dr. McDiarmid about your headaches.  Try to keep a headache diary to identify  triggers.    ED Prescriptions     Medication Sig Dispense Auth. Provider   SUMAtriptan (IMITREX) 50 MG tablet Take 1 tablet (50 mg total) by mouth every 2 (two) hours as needed for migraine. May repeat in 2 hours if headache persists or recurs. 10 tablet Carvel Getting, NP   ondansetron (ZOFRAN) 4 MG tablet Take 1 tablet (4 mg total) by mouth every 8 (eight) hours as needed for nausea or vomiting. 20 tablet Carvel Getting, NP      PDMP not reviewed this encounter.   Carvel Getting, NP 03/12/21 [phone]

## 2021-03-13 ENCOUNTER — Ambulatory Visit (INDEPENDENT_AMBULATORY_CARE_PROVIDER_SITE_OTHER): Payer: 59 | Admitting: Family Medicine

## 2021-03-13 ENCOUNTER — Ambulatory Visit (INDEPENDENT_AMBULATORY_CARE_PROVIDER_SITE_OTHER): Payer: 59

## 2021-03-13 ENCOUNTER — Other Ambulatory Visit: Payer: Self-pay

## 2021-03-13 VITALS — BP 131/94 | HR 83 | Wt 229.0 lb

## 2021-03-13 DIAGNOSIS — G44009 Cluster headache syndrome, unspecified, not intractable: Secondary | ICD-10-CM

## 2021-03-13 DIAGNOSIS — Z23 Encounter for immunization: Secondary | ICD-10-CM

## 2021-03-13 DIAGNOSIS — I10 Essential (primary) hypertension: Secondary | ICD-10-CM | POA: Diagnosis not present

## 2021-03-13 MED ORDER — OLMESARTAN MEDOXOMIL 20 MG PO TABS: 20.0000 mg | ORAL_TABLET | Freq: Every day | ORAL | 3 refills | Status: DC

## 2021-03-13 NOTE — Progress Notes (Signed)
    SUBJECTIVE:   CHIEF COMPLAINT / HPI:   Alexander Gentry is a 50 yo M who presents for the below.  Hypertension Told by UC provider to follow up with primary about elevated blood pressures.  - Medications: HCTZ 25 mg daily - Compliance: Yes - Checking BP at home: Yes, avg 150/90 - Denies any SOB, CP, vision changes, LE edema, medication SEs, or symptoms of hypotension  Headaches Went to urgent care Monday due to worsening headache. Has been intermittent for several months but this was the worst. Does not currently have headache. Did have 1 around 5 am and he took sumatriptan (given by UC) this morning. It usually occurs on 1 side and that eye becomes red and painful. He also tears up in that eye. He has been told that he snores at night but unsure if he has apnea episodes.  PERTINENT  PMH / PSH: Obesity, GERD, HLD  OBJECTIVE:   BP (!) 131/94   Pulse 83   Wt 229 lb (103.9 kg)   SpO2 100%   BMI 35.87 kg/m   General: Appears well, no acute distress. Age appropriate. HEENT: PERRL. Normal conjunctivae. No tearing. Sinuses are non tender.  Cardiac: RRR, normal heart sounds, no murmurs Respiratory: CTAB, normal effort Skin: Warm and dry, no rashes noted Neuro: alert and oriented Psych: normal affect   ASSESSMENT/PLAN:   1. Hypertension goal BP (blood pressure) < 140/90 BP mildly elevated today. Reviewed patient home BP log with pressures as high as 160/100. Discussed with Dr. Owens Shark addition of medication with some benefit for headaches. Repeat BMP today and follow up in 2 weeks for BMP repeat and BP check.  - Basic Metabolic Panel - olmesartan (BENICAR) 20 MG tablet; Take 1 tablet (20 mg total) by mouth at bedtime.  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 In addition to medication with possible dual benefit, can consider sleep study in the future. Discussed use of sumatriptan not being used more than  9 times in a month. Patient voiced understanding. ED precautions given.  - olmesartan (BENICAR) 20 MG tablet; Take 1 tablet (20 mg total) by mouth at bedtime.  Dispense: 90 tablet; Refill: 3  3. Need for pneumococcal vaccination - Pneumococcal conjugate vaccine 20-valent (Prevnar 20)  Gerlene Fee, DO [place]

## 2021-03-13 NOTE — Patient Instructions (Signed)
Thank for coming in today today we discussed high blood pressure in which I have added a medication called olmesartan to be taken daily along with your hydrochlorothiazide.  This can also have some benefit with headaches.  Please keep in mind we will prefer you not take sumatriptan hand more than 9 times in a month.  If headaches continue to increase in frequency and you develop nausea vomiting limb weakness please be seen in the ED.  Follow-up in 2 weeks for blood pressure recheck.  Dr. Janus Molder

## 2021-03-13 NOTE — Progress Notes (Signed)
   Covid-19 Vaccination Clinic  Name:  Alexander Gentry    MRN: [id number] DOB: [birth year]  03/13/2021  Mr. Marrow was observed post Covid-19 immunization for 15 minutes without incident. He was provided with Vaccine Information Sheet and instruction to access the V-Safe system.   [name] was instructed to call [phone] with any severe reactions post vaccine: Difficulty breathing  Swelling of face and throat  A fast heartbeat  A bad rash all over body  Dizziness and weakness

## 2021-07-10 ENCOUNTER — Encounter: Payer: Self-pay | Admitting: Podiatry

## 2021-07-25 ENCOUNTER — Ambulatory Visit (INDEPENDENT_AMBULATORY_CARE_PROVIDER_SITE_OTHER): Payer: Self-pay | Admitting: Family Medicine

## 2021-07-25 ENCOUNTER — Encounter: Payer: Self-pay | Admitting: Family Medicine

## 2021-07-25 VITALS — BP 129/81 | HR 65 | Ht 67.0 in | Wt 232.6 lb

## 2021-07-25 DIAGNOSIS — Z9189 Other specified personal risk factors, not elsewhere classified: Secondary | ICD-10-CM

## 2021-07-25 DIAGNOSIS — Z131 Encounter for screening for diabetes mellitus: Secondary | ICD-10-CM

## 2021-07-25 DIAGNOSIS — Z6833 Body mass index (BMI) 33.0-33.9, adult: Secondary | ICD-10-CM

## 2021-07-25 DIAGNOSIS — K219 Gastro-esophageal reflux disease without esophagitis: Secondary | ICD-10-CM

## 2021-07-25 DIAGNOSIS — L2082 Flexural eczema: Secondary | ICD-10-CM

## 2021-07-25 DIAGNOSIS — I10 Essential (primary) hypertension: Secondary | ICD-10-CM

## 2021-07-25 DIAGNOSIS — E785 Hyperlipidemia, unspecified: Secondary | ICD-10-CM

## 2021-07-25 DIAGNOSIS — E669 Obesity, unspecified: Secondary | ICD-10-CM

## 2021-07-25 DIAGNOSIS — Z79899 Other long term (current) drug therapy: Secondary | ICD-10-CM

## 2021-07-25 DIAGNOSIS — L83 Acanthosis nigricans: Secondary | ICD-10-CM

## 2021-07-25 DIAGNOSIS — G43809 Other migraine, not intractable, without status migrainosus: Secondary | ICD-10-CM

## 2021-07-25 MED ORDER — SUMATRIPTAN SUCCINATE 100 MG PO TABS: 100.0000 mg | ORAL_TABLET | Freq: Every day | ORAL | 0 refills | Status: DC | PRN

## 2021-07-25 MED ORDER — HYDROCHLOROTHIAZIDE 25 MG PO TABS: 25.0000 mg | ORAL_TABLET | Freq: Every day | ORAL | 3 refills | Status: DC

## 2021-07-25 MED ORDER — OMEPRAZOLE 20 MG PO CPDR: 20.0000 mg | DELAYED_RELEASE_CAPSULE | Freq: Every day | ORAL | 3 refills | Status: DC

## 2021-07-25 MED ORDER — TRIAMCINOLONE ACETONIDE 0.1 % EX OINT: 1.0000 "application " | TOPICAL_OINTMENT | Freq: Two times a day (BID) | CUTANEOUS | 2 refills | Status: DC

## 2021-07-25 NOTE — Patient Instructions (Addendum)
Your blood pressure is under very good control.  Continue the olmesartan and HCTZ. ? ?Make sure to check out at the front desk before you leave. ? ?If you had blood tests today, Dr Arnet Hofferber will send you a MyChart message or a letter if the results are normal.  Otherwise, he will give you a call.  ? ?If Dr Therman Hughlett ordered you to have a referral, you should hear from the referral's office to schedule an appointment.   ? ?If you have not heard from the referral's office within 5 business-days, please let Dr Duy Lemming know by sending him a message through [place], or calling the [facility] [phone]) to leave him a message.  ? ? ? ?

## 2021-07-26 ENCOUNTER — Encounter: Payer: Self-pay | Admitting: Family Medicine

## 2021-07-26 DIAGNOSIS — G43909 Migraine, unspecified, not intractable, without status migrainosus: Secondary | ICD-10-CM | POA: Insufficient documentation

## 2021-07-26 DIAGNOSIS — Z9189 Other specified personal risk factors, not elsewhere classified: Secondary | ICD-10-CM | POA: Insufficient documentation

## 2021-07-26 DIAGNOSIS — L83 Acanthosis nigricans: Secondary | ICD-10-CM | POA: Insufficient documentation

## 2021-07-26 DIAGNOSIS — L2082 Flexural eczema: Secondary | ICD-10-CM

## 2021-07-26 HISTORY — DX: Flexural eczema: L20.82

## 2021-07-26 HISTORY — DX: Acanthosis nigricans: L83

## 2021-07-26 HISTORY — DX: Migraine, unspecified, not intractable, without status migrainosus: G43.909

## 2021-07-26 LAB — LIPID PANEL
Chol/HDL Ratio: 4 ratio (ref 0.0–5.0)
Cholesterol, Total: 182 mg/dL (ref 100–199)
HDL: 45 mg/dL (ref >39)
LDL Chol Calc (NIH): 127 mg/dL — ABNORMAL HIGH (ref 0–99)
Triglycerides: 54 mg/dL (ref 0–149)
VLDL Cholesterol Cal: 10 mg/dL (ref 5–40)

## 2021-07-26 LAB — BASIC METABOLIC PANEL
BUN/Creatinine Ratio: 11 (ref 9–20)
BUN: 11 mg/dL (ref 6–24)
CO2: 24 mmol/L (ref 20–29)
Calcium: 9.3 mg/dL (ref 8.7–10.2)
Chloride: 104 mmol/L (ref 96–106)
Creatinine, Ser: 1.02 mg/dL (ref 0.76–1.27)
Glucose: 85 mg/dL (ref 70–99)
Potassium: 4.5 mmol/L (ref 3.5–5.2)
Sodium: 141 mmol/L (ref 134–144)
eGFR: 89 mL/min/{1.73_m2} (ref >59)

## 2021-07-26 LAB — HEMOGLOBIN A1C
Est. average glucose Bld gHb Est-mCnc: 117 mg/dL
Hgb A1c MFr Bld: 5.7 % — ABNORMAL HIGH (ref 4.8–5.6)

## 2021-07-26 NOTE — Assessment & Plan Note (Signed)
Established problem ?No recurrence since the classic presentation of some form of unilateral cephalalgia last November. ?This event was so severe that he would like to keep sumatriptan available should he have a recurrence.  ?We discussed stepped approach for headaches with NSAID or Excedrin Migraine early in start ofHeadache, with use of triptan if further progression of headache at one hour.  ? ?

## 2021-07-26 NOTE — Progress Notes (Signed)
Alexander Gentry is alone ?Sources of clinical information for visit is/are patient. ?Nursing assessment for this office visit was reviewed with the patient for accuracy and revision.  ? ? ? ?Previous Report(s) Reviewed: office notes  ? ?  07/25/2021  ?  9:08 AM  ?Depression screen PHQ 2/9  ?Decreased Interest 0  ?Down, Depressed, Hopeless 0  ?PHQ - 2 Score 0  ?Altered sleeping 0  ?Tired, decreased energy 0  ?Change in appetite 0  ?Feeling bad or failure about yourself  0  ?Trouble concentrating 0  ?Moving slowly or fidgety/restless 0  ?Suicidal thoughts 0  ?PHQ-9 Score 0  ?Difficult doing work/chores Not difficult at all  ? ?[place] Office Visit from 07/25/2021 in [place] Office Visit from 03/13/2021 in [place] Office Visit from 09/06/2020 in [place]  ?Thoughts that you would be better off dead, or of hurting yourself in some way Not at all Not at all Not at all  ?PHQ-9 Total Score 0 0 0  ? ?  ?  ? ?  03/13/2021  ?  9:02 AM  ?Fall Risk   ?Falls in the past year? 0  ?Number falls in past yr: 0  ?Injury with Fall? 0  ? ? ? ?  07/25/2021  ?  9:08 AM 03/13/2021  ?  9:02 AM 09/06/2020  ?  9:39 AM  ?PHQ9 SCORE ONLY  ?PHQ-9 Total Score 0 0 0  ? ? ?Adult vaccines due  ?Topic Date Due  ? TETANUS/TDAP  09/07/2030  ? ? ?Health Maintenance Due  ?Topic Date Due  ? Zoster Vaccines- Shingrix (2 of 2) 04/23/2021  ?  ? ? ?History/P.E. limitations: none ? ?Adult vaccines due  ?Topic Date Due  ? TETANUS/TDAP  09/07/2030  ? There are no preventive care reminders to display for this patient.  ?Health Maintenance Due  ?Topic Date Due  ? Zoster Vaccines- Shingrix (2 of 2) 04/23/2021  ?  ? ?Chief Complaint  ?Patient presents with  ? Hypertension  ? Hyperlipidemia  ? Gastroesophageal Reflux  ?  ? ?

## 2021-07-26 NOTE — Assessment & Plan Note (Addendum)
Established problem unimproved condition but stable  ?Wt Readings from Last 3 Encounters:  ?07/25/21 232 lb 9.6 oz (105.5 kg)  ?03/13/21 229 lb (103.9 kg)  ?09/06/20 230 lb 9.6 oz (104.6 kg)  ? ?Lab Results  ?Component Value Date  ? HGBA1C 5.7 (H) 07/25/2021  ? ? ?Discussed role of diet and exercise in healthy lifestyle, espcially in setting of AT-Risk for Diabetes A1c level.  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  Re: old [place] study. ? ?He was waiting for this physical before starting back at [employer].   ?I believe Mr Phineas Douglas can start a graduated exercise program safely.  ? ? ? ? ?

## 2021-07-26 NOTE — Assessment & Plan Note (Signed)
New problem ?1-2 cm thin plaque of eczematoid skin in right antecubitus ?A/ presumed nonspecific eczema ?P/ Trial triamcinolone 0.1% daily until resolves, if DN resolve then patient to let me know.  ?

## 2021-07-26 NOTE — Assessment & Plan Note (Signed)
BP 129/81   Pulse 65   Ht '5\' 7"'$  (1.702 m)   Wt 232 lb 9.6 oz (105.5 kg)   SpO2 98%   BMI 36.43 kg/m?  ?Established problem ?Well Controlled. ?No signs of complications, medication side effects, or red flags. ?Continue olmesartan and hctz ? ?

## 2021-07-26 NOTE — Assessment & Plan Note (Signed)
New finding ?Velvety darkened patches of skin at right axilla and nape of neck ?Axilla skin lesion is about 3 cm by 2 cm ovoid in shape, fairly well circumscribed. ?Lab Results  ?Component Value Date  ? HGBA1C 5.7 (H) 07/25/2021  ? ?A/ Working diagnosis is both is Acanthosis nigricans, with atypical pruritus at axilla lesion (is it also eczema?).  Patient's A1c is in the At Risk of Diabetes range.  ? ?

## 2021-07-26 NOTE — Assessment & Plan Note (Addendum)
Established problem ?Lipid Panel  ?   ?Component Value Date/Time  ? CHOL 182 07/25/2021 1231  ? TRIG 54 07/25/2021 1231  ? HDL 45 07/25/2021 1231  ? CHOLHDL 4.0 07/25/2021 1231  ? CHOLHDL 3.7 02/15/2015 1210  ? VLDL 17 02/15/2015 1210  ? LDLCALC 127 (H) 07/25/2021 1231  ? LDLDIRECT 204 (H) 12/20/2015 1222  ? LABVLDL 10 07/25/2021 1231  ?Good response to Atorvastatin 40 mg daily for primary prevention.  ? ?Well Controlled. ?No signs of complications, medication side effects, or red flags. ?Continue atorvastatin ?Will discuss addition of Zetia for further reduction in LDL ? ?

## 2021-07-26 NOTE — Assessment & Plan Note (Signed)
Lab Results  ?Component Value Date  ? HGBA1C 5.7 (H) 07/25/2021  ?Obesity, AA ethnicity, Acanthosis nigrans, no first degree relatives with diabetes mellitus ?Lifestyle interventions ? ? ?

## 2021-07-26 NOTE — Assessment & Plan Note (Signed)
Established problem. Stable.  No signs of complications, medication side effects, or red flags. Continue current medications and other regiments.  

## 2021-08-23 ENCOUNTER — Other Ambulatory Visit: Payer: Self-pay | Admitting: Family Medicine

## 2021-08-23 DIAGNOSIS — G43809 Other migraine, not intractable, without status migrainosus: Secondary | ICD-10-CM

## 2021-09-17 ENCOUNTER — Encounter: Payer: Self-pay | Admitting: *Deleted

## 2021-11-18 ENCOUNTER — Other Ambulatory Visit: Payer: Self-pay | Admitting: Family Medicine

## 2021-11-18 DIAGNOSIS — E785 Hyperlipidemia, unspecified: Secondary | ICD-10-CM

## 2021-12-10 ENCOUNTER — Ambulatory Visit (HOSPITAL_COMMUNITY)
Admission: EM | Admit: 2021-12-10 | Discharge: 2021-12-10 | Disposition: A | Discharge status: Home / Self Care | Payer: Commercial Managed Care - HMO | Attending: Internal Medicine | Admitting: Internal Medicine

## 2021-12-10 ENCOUNTER — Encounter (HOSPITAL_COMMUNITY): Payer: Self-pay | Admitting: *Deleted

## 2021-12-10 DIAGNOSIS — M62830 Muscle spasm of back: Secondary | ICD-10-CM

## 2021-12-10 DIAGNOSIS — M5441 Lumbago with sciatica, right side: Secondary | ICD-10-CM

## 2021-12-10 DIAGNOSIS — M5442 Lumbago with sciatica, left side: Secondary | ICD-10-CM | POA: Diagnosis not present

## 2021-12-10 MED ORDER — TIZANIDINE HCL 4 MG PO TABS: 4.0000 mg | ORAL_TABLET | Freq: Four times a day (QID) | ORAL | 0 refills | Status: DC | PRN

## 2021-12-10 MED ORDER — METHYLPREDNISOLONE SODIUM SUCC 125 MG IJ SOLR
INTRAMUSCULAR | Status: AC
Start: 2021-12-10 — End: ?
  Filled 2021-12-10: qty 2

## 2021-12-10 MED ORDER — PREDNISONE 20 MG PO TABS: 40.0000 mg | ORAL_TABLET | Freq: Every day | ORAL | 0 refills | Status: AC

## 2021-12-10 MED ORDER — METHYLPREDNISOLONE SODIUM SUCC 125 MG IJ SOLR
80.0000 mg | Freq: Once | INTRAMUSCULAR | Status: AC
  Administered 2021-12-10: 80 mg via INTRAMUSCULAR

## 2021-12-10 MED ORDER — STERILE WATER FOR INJECTION IJ SOLN
INTRAMUSCULAR | Status: AC
  Filled 2021-12-10: qty 10

## 2021-12-10 MED ORDER — LIDOCAINE HCL (PF) 1 % IJ SOLN
INTRAMUSCULAR | Status: AC
  Filled 2021-12-10: qty 2

## 2021-12-10 NOTE — Discharge Instructions (Addendum)
You have acute low back pain with bilateral sciatica.  We gave you an injection of steroid in the clinic to help reduce inflammation and pain associated with this. Start taking prednisone 40 mg (2 tablets) by mouth tomorrow morning with breakfast for the next 5 days.  Do not take any NSAID containing medications while taking prednisone due to increased risk of GI bleeding as discussed in clinic.   You may take Zanaflex muscle relaxer every 6 hours as needed for muscle spasm associated with this.  Do not take this medicine and drink alcohol or drive as it can make you sleepy.  Work note was at the end [employer].  Rest, apply warm compresses with a heating pad to your low back, and perform gentle range of motion exercises over the next few days before returning to work.   If you develop any new or worsening symptoms or do not improve in the next 2 to 3 days, please return.  If your symptoms are severe, please go to the emergency room.  Follow-up with your primary care provider for further evaluation and management of your symptoms as well as ongoing wellness visits.  I hope you feel better!

## 2021-12-10 NOTE — ED Provider Notes (Signed)
[facility]    CSN: [id number] Arrival date & time: 12/10/21  0865      History   Chief Complaint Chief Complaint  Patient presents with   Back Pain    HPI Alexander Gentry is a 51 y.o. male.   Patient presents to urgent care for evaluation of low back pain that started upon waking this morning.  He states that he feels the pain radiate down both of his legs and is worse with movement.  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  States he lifts with good lifting techniques and uses his legs to help support the weight.  He has had similar symptoms in the past 20 years ago and diagnosed with sciatic nerve pain.  He has not had any issues with his back since then.  Denies recent injuries, falls, or trauma to the lower back area.  No urinary/stool incontinence, fever/chills, nausea, vomiting, difficulty walking, or saddle anesthesia symptoms. Pain woke him up out of his sleep last night and he took 2 Aleve tablets at approximately 2:30 in the morning this morning without relief of pain.  Pain is currently a 10 on a scale of 0-10 and patient describes this as a throbbing sharp pain.  Denies any other triggering or relieving factors at this time for symptoms.  Denies diagnosis of diabetes and previous history of back problems/surgeries.   Back Pain   Past Medical History:  Diagnosis Date   Chronic pain of left knee 04/23/2017   Chronic pain of right knee 11/17/2013   S/P ACL and meniscectomy on right knee in 2014.    Diverticulitis    Elevated transaminase level 12/07/2012   HYPERLIPIDEMIA 05/13/2007   Hyperplastic colon polyp 05/2019   Screening colonoscopy by Dr Carlean Purl (GI) - Follow up in 10 years recommended.    Hypertension 11/2010   OBESITY 05/13/2007   Perforation of sigmoid colon due to diverticulitis 04/24/2019   ROTATOR CUFF INJURY, RIGHT SHOULDER 05/24/2009    Patient Active Problem List   Diagnosis Date Noted   Acanthosis  nigricans 07/26/2021   Flexural eczema 07/26/2021   Migraine 07/26/2021   At risk of diabetes mellitus 07/26/2021   Gastroesophageal reflux disease 09/07/2020   Hypertension goal BP (blood pressure) < 140/90 06/22/2008    Class: Chronic   Hyperlipidemia LDL goal <130 05/13/2007   OBESITY 05/13/2007    Past Surgical History:  Procedure Laterality Date   KNEE ARTHROSCOPY WITH ANTERIOR CRUCIATE LIGAMENT (ACL) REPAIR Right 2014   Dr Ivin Booty Manson Passey, [facility]) Cadaveric ACL transplant   MENISCUS REPAIR Right 2014   Dr Ivin Booty Manson Passey, [facility])       Home Medications    Prior to Admission medications   Medication Sig Start Date End Date Taking? Authorizing Provider  atorvastatin (LIPITOR) 40 MG tablet TAKE 1 TABLET BY MOUTH EVERY DAY 11/19/21  Yes McDiarmid, Blane Ohara, MD  hydrochlorothiazide (HYDRODIURIL) 25 MG tablet Take 1 tablet (25 mg total) by mouth daily. 07/25/21  Yes McDiarmid, Blane Ohara, MD  olmesartan (BENICAR) 20 MG tablet Take 1 tablet (20 mg total) by mouth at bedtime. 03/13/21  Yes Autry-Lott, Naaman Plummer, DO  omeprazole (PRILOSEC) 20 MG capsule Take 1 capsule (20 mg total) by mouth daily. 07/25/21 12/10/21 Yes McDiarmid, Blane Ohara, MD  predniSONE (DELTASONE) 20 MG tablet Take 2 tablets (40 mg total) by mouth daily for 5 days. 12/10/21 12/15/21 Yes Talbot Grumbling, FNP  SUMAtriptan (IMITREX) 100 MG tablet TAKE 1  TABLET BY MOUTH DAILY AS NEEDED FOR MIGRAINE. MAY REPEAT IN 2 HOURS IF HEADACHE PERSISTS OR RECURS. 08/23/21  Yes Eniola, Phill Myron, MD  tiZANidine (ZANAFLEX) 4 MG tablet Take 1 tablet (4 mg total) by mouth every 6 (six) hours as needed for muscle spasms. 12/10/21  Yes Talbot Grumbling, FNP  triamcinolone ointment (KENALOG) 0.1 % Apply 1 application. topically 2 (two) times daily. Apply twice a day to itching rashes.  Once itching under control stop the med.  It may return.  Restart medication if it does. 07/25/21  Yes McDiarmid, Blane Ohara, MD    Family History Family  History  Problem Relation Age of Onset   Hypertension Mother    Diabetes type II Maternal Uncle    Colon cancer Neg Hx    Esophageal cancer Neg Hx    Rectal cancer Neg Hx    Stomach cancer Neg Hx     Social History Social History   Tobacco Use   Smoking status: Never   Smokeless tobacco: Never  Vaping Use   Vaping Use: Never used  Substance Use Topics   Alcohol use: Yes    Alcohol/week: 1.0 standard drink of alcohol    Types: 1 Standard drinks or equivalent per week    Comment: socially   Drug use: No     Allergies   Penicillins   Review of Systems Review of Systems  Musculoskeletal:  Positive for back pain.  Per HPI   Physical Exam Triage Vital Signs ED Triage Vitals  Enc Vitals Group     BP 12/10/21 0825 (!) 145/88     Pulse Rate 12/10/21 0825 67     Resp 12/10/21 0825 18     Temp 12/10/21 0825 98.2 F (36.8 C)     Temp Source 12/10/21 0825 Oral     SpO2 12/10/21 0825 100 %     Weight --      Height --      Head Circumference --      Peak Flow --      Pain Score 12/10/21 0822 10     Pain Loc --      Pain Edu? --      Excl. in [place]? --    No data found.  Updated Vital Signs BP (!) 145/88 (BP Location: Left Arm)   Pulse 67   Temp 98.2 F (36.8 C) (Oral)   Resp 18   SpO2 100%   Visual Acuity Right Eye Distance:   Left Eye Distance:   Bilateral Distance:    Right Eye Near:   Left Eye Near:    Bilateral Near:     Physical Exam Vitals and nursing note reviewed.  Constitutional:      Appearance: Normal appearance. He is obese. He is ill-appearing. He is not toxic-appearing.     Comments: Very pleasant patient sitting on exam table in obvious discomfort due to back pain.  HENT:     Head: Normocephalic and atraumatic.     Right Ear: Hearing and external ear normal.     Left Ear: Hearing and external ear normal.     Nose: Nose normal.     Mouth/Throat:     Lips: Pink.     Mouth: Mucous membranes are moist.  Eyes:     General: Lids are  normal. Vision grossly intact. Gaze aligned appropriately.     Extraocular Movements: Extraocular movements intact.     Conjunctiva/sclera: Conjunctivae normal.  Pulmonary:  Effort: Pulmonary effort is normal.  Abdominal:     Palpations: Abdomen is soft.  Musculoskeletal:     Cervical back: Neck supple.     Comments: Tender to palpation of bilateral sciatic nerve joints with radiculopathy elicited to the bilateral lower extremities to the bilateral mid thighs.  Tenderness to palpation of the lumbar paraspinal muscles.  No TTP to the spinous processes of the C, T, and L-spine.  No obvious deformity, swelling, ecchymosis, laceration, or sign of injury to the back.  Range of motion is limited due to tenderness to the lower back.  5/5 power throughout.  Normal sensation to bilateral lower extremities.  Skin:    General: Skin is warm and dry.     Capillary Refill: Capillary refill takes less than 2 seconds.     Findings: No rash.  Neurological:     General: No focal deficit present.     Mental Status: He is alert and oriented to person, place, and time. Mental status is at baseline.     Cranial Nerves: No dysarthria or facial asymmetry.     Gait: Gait is intact.  Psychiatric:        Mood and Affect: Mood normal.        Speech: Speech normal.        Behavior: Behavior normal.        Thought Content: Thought content normal.        Judgment: Judgment normal.      UC Treatments / Results  Labs (all labs ordered are listed, but only abnormal results are displayed) Labs Reviewed - No data to display  EKG   Radiology No results found.  Procedures Procedures (including critical care time)  Medications Ordered in UC Medications  methylPREDNISolone sodium succinate (SOLU-MEDROL) 125 mg/2 mL injection 80 mg (has no administration in time range)    Initial Impression / Assessment and Plan / UC Course  I have reviewed the triage vital signs and the nursing notes.  Pertinent labs &  imaging results that were available during my care of the patient were reviewed by me and considered in my medical decision making (see chart for details).   1.  Acute low back pain with bilateral sciatica Symptoms and physical exam consistent with sciatic nerve pain.  We will manage this with steroid injection in the clinic 80 mg Solu-Medrol since patient had naproxen approximately 7 hours ago.  He is to take prednisone 40 mg once daily for the next 5 days starting tomorrow with breakfast.  Advised to avoid NSAID medications while taking prednisone due to increased risk of GI bleeding.  He may use Zanaflex muscle relaxer every 6 hours as needed for muscle spasm.  Advised to avoid drinking alcohol, going to work, and driving while taking this medicine due to drowsiness side effect.  Heat and gentle range of motion exercises advised.  Work note given.   Discussed physical exam and available lab work findings in clinic with patient.  Counseled patient regarding appropriate use of medications and potential side effects for all medications recommended or prescribed today. Discussed red flag signs and symptoms of worsening condition,when to call the PCP office, return to urgent care, and when to seek higher level of care in the emergency department. Patient verbalizes understanding and agreement with plan. All questions answered. Patient discharged in stable condition.  Final Clinical Impressions(s) / UC Diagnoses   Final diagnoses:  Acute bilateral low back pain with bilateral sciatica  Muscle spasm of back  Discharge Instructions      You have acute low back pain with bilateral sciatica.  We gave you an injection of steroid in the clinic to help reduce inflammation and pain associated with this. Start taking prednisone 40 mg (2 tablets) by mouth tomorrow morning with breakfast for the next 5 days.  Do not take any NSAID containing medications while taking prednisone due to increased risk of GI  bleeding as discussed in clinic.   You may take Zanaflex muscle relaxer every 6 hours as needed for muscle spasm associated with this.  Do not take this medicine and drink alcohol or drive as it can make you sleepy.  Work note was at the end [employer].  Rest, apply warm compresses with a heating pad to your low back, and perform gentle range of motion exercises over the next few days before returning to work.   If you develop any new or worsening symptoms or do not improve in the next 2 to 3 days, please return.  If your symptoms are severe, please go to the emergency room.  Follow-up with your primary care provider for further evaluation and management of your symptoms as well as ongoing wellness visits.  I hope you feel better!     ED Prescriptions     Medication Sig Dispense Auth. Provider   predniSONE (DELTASONE) 20 MG tablet Take 2 tablets (40 mg total) by mouth daily for 5 days. 10 tablet Talbot Grumbling, FNP   tiZANidine (ZANAFLEX) 4 MG tablet Take 1 tablet (4 mg total) by mouth every 6 (six) hours as needed for muscle spasms. 30 tablet Talbot Grumbling, FNP      PDMP not reviewed this encounter.   Talbot Grumbling, [place] 12/10/21 [phone]

## 2021-12-10 NOTE — ED Triage Notes (Signed)
Pt states that he has lower back pain that started this morning radiating down left leg. No injury just woke up this morning with it.

## 2021-12-15 ENCOUNTER — Other Ambulatory Visit: Payer: Self-pay | Admitting: Family Medicine

## 2021-12-15 DIAGNOSIS — K219 Gastro-esophageal reflux disease without esophagitis: Secondary | ICD-10-CM

## 2021-12-17 MED ORDER — OMEPRAZOLE 20 MG PO CPDR: 20.0000 mg | DELAYED_RELEASE_CAPSULE | Freq: Every day | ORAL | 3 refills | Status: AC

## 2021-12-24 ENCOUNTER — Ambulatory Visit (HOSPITAL_COMMUNITY)
Admission: EM | Admit: 2021-12-24 | Discharge: 2021-12-24 | Disposition: A | Discharge status: Home / Self Care | Payer: Commercial Managed Care - HMO | Attending: Internal Medicine | Admitting: Internal Medicine

## 2021-12-24 ENCOUNTER — Encounter (HOSPITAL_COMMUNITY): Payer: Self-pay | Admitting: *Deleted

## 2021-12-24 ENCOUNTER — Ambulatory Visit (INDEPENDENT_AMBULATORY_CARE_PROVIDER_SITE_OTHER): Payer: Commercial Managed Care - HMO

## 2021-12-24 DIAGNOSIS — M5441 Lumbago with sciatica, right side: Secondary | ICD-10-CM

## 2021-12-24 DIAGNOSIS — M545 Low back pain, unspecified: Secondary | ICD-10-CM

## 2021-12-24 DIAGNOSIS — M5442 Lumbago with sciatica, left side: Secondary | ICD-10-CM | POA: Diagnosis not present

## 2021-12-24 MED ORDER — NAPROXEN 500 MG PO TABS: 500.0000 mg | ORAL_TABLET | Freq: Two times a day (BID) | ORAL | 0 refills | Status: DC

## 2021-12-24 MED ORDER — METHYLPREDNISOLONE SODIUM SUCC 125 MG IJ SOLR
INTRAMUSCULAR | Status: AC
  Filled 2021-12-24: qty 2

## 2021-12-24 MED ORDER — METHYLPREDNISOLONE SODIUM SUCC 125 MG IJ SOLR
125.0000 mg | Freq: Once | INTRAMUSCULAR | Status: AC
  Administered 2021-12-24: 125 mg via INTRAMUSCULAR

## 2021-12-24 NOTE — ED Triage Notes (Signed)
Pt states back no better with muscle relaxer's and tylenol. He is seeing PCP on Thursday but couldn't wait for that appt.

## 2021-12-24 NOTE — Discharge Instructions (Addendum)
We gave you a steroid injection in the clinic today for your bilateral lower back pain with sciatic nerve involvement. Your x-ray was negative for acute bony abnormality to your back.  This is very good news and very reassuring! I would like for you to start taking naproxen anti-inflammatory medicine starting tomorrow twice daily for the next 2 to 3 days to reduce inflammation to your back.  Do not take any naproxen until tomorrow since we gave you the steroid shot in the clinic today. Take omeprazole 20 mg daily while taking naproxen to protect your stomach.  Use heat and perform gentle range of motion exercises and stretches.   Follow-up with PCP as scheduled on Thursday for further evaluation and ongoing management of this.   If you develop any new or worsening symptoms or do not improve in the next 2 to 3 days, please return.  If your symptoms are severe, please go to the emergency room.  Follow-up with your primary care provider for further evaluation and management of your symptoms as well as ongoing wellness visits.  I hope you feel better!

## 2021-12-24 NOTE — ED Provider Notes (Signed)
[place]    CSN: [id number] Arrival date & time: 12/24/21  3235      History   Chief Complaint Chief Complaint  Patient presents with   Back Pain    HPI Alexander Gentry is a 51 y.o. male.   Patient presents to urgent care for evaluation of ongoing low back pain to both sides of his back that initially started a few weeks ago. He came to urgent care, was given a steroid injection and steroid burst with muscle relaxer for bilateral sciatica. Symptoms improved initially with interventions, but pain returned. Pain is worse with movement and walking. Reports radiculopathy to bilateral posterior knees from lower back. Denies numbness/tingling in bilateral feet, but shooting pain to knees is sharp and intermittent. Denies abdominal pain, nausea, vomiting, recent falls/injuries to the back, and head pain. No dizziness, neck pain, or fever/chills reported. No saddle anesthesia symptoms. Last bowel movement was yesterday and normal, no constipation or diarrhea. No urinary symptoms or bowel/bladder incontinence. Pain is currently a 10 on a scale of 0-10 to the lower back. He has been taking tylenol and muscle relaxer prescribed a previous visit, but states muscle relaxer doesn't help much and only makes him sleepy. He has been doing some gentle stretches as advised at last visit as well, but pain persists.    Back Pain   Past Medical History:  Diagnosis Date   Acanthosis nigricans 07/26/2021   Chronic pain of left knee 04/23/2017   Chronic pain of right knee 11/17/2013   S/P ACL and meniscectomy on right knee in 2014.    Diverticulitis    Elevated transaminase level 12/07/2012   HYPERLIPIDEMIA 05/13/2007   Hyperplastic colon polyp 05/2019   Screening colonoscopy by Dr Carlean Purl (GI) - Follow up in 10 years recommended.    Hypertension 11/2010   OBESITY 05/13/2007   Perforation of sigmoid colon due to diverticulitis 04/24/2019   ROTATOR CUFF INJURY, RIGHT SHOULDER 05/24/2009     Patient Active Problem List   Diagnosis Date Noted   Flexural eczema 07/26/2021   Migraine 07/26/2021   At risk of diabetes mellitus 07/26/2021   Gastroesophageal reflux disease 09/07/2020   Hypertension goal BP (blood pressure) < 140/90 06/22/2008    Class: Chronic   Hyperlipidemia LDL goal <130 05/13/2007   OBESITY 05/13/2007    Past Surgical History:  Procedure Laterality Date   KNEE ARTHROSCOPY WITH ANTERIOR CRUCIATE LIGAMENT (ACL) REPAIR Right 2014   Dr Ivin Booty Manson Passey, [facility]) Cadaveric ACL transplant   MENISCUS REPAIR Right 2014   Dr Ivin Booty Manson Passey, [facility])       Home Medications    Prior to Admission medications   Medication Sig Start Date End Date Taking? Authorizing Provider  atorvastatin (LIPITOR) 40 MG tablet TAKE 1 TABLET BY MOUTH EVERY DAY 11/19/21  Yes McDiarmid, Blane Ohara, MD  hydrochlorothiazide (HYDRODIURIL) 25 MG tablet Take 1 tablet (25 mg total) by mouth daily. 07/25/21  Yes McDiarmid, Blane Ohara, MD  naproxen (NAPROSYN) 500 MG tablet Take 1 tablet (500 mg total) by mouth 2 (two) times daily. 12/24/21  Yes Talbot Grumbling, FNP  olmesartan (BENICAR) 20 MG tablet Take 1 tablet (20 mg total) by mouth at bedtime. 03/13/21  Yes Autry-Lott, Naaman Plummer, DO  omeprazole (PRILOSEC) 20 MG capsule Take 1 capsule (20 mg total) by mouth daily. 12/17/21 12/12/22 Yes McDiarmid, Blane Ohara, MD  SUMAtriptan (IMITREX) 100 MG tablet TAKE 1 TABLET BY MOUTH DAILY AS NEEDED FOR MIGRAINE. MAY REPEAT IN 2 HOURS IF HEADACHE PERSISTS  OR RECURS. 08/23/21  Yes Kinnie Feil, MD  tiZANidine (ZANAFLEX) 4 MG tablet Take 1 tablet (4 mg total) by mouth every 6 (six) hours as needed for muscle spasms. 12/10/21  Yes Talbot Grumbling, FNP  triamcinolone ointment (KENALOG) 0.1 % Apply 1 application. topically 2 (two) times daily. Apply twice a day to itching rashes.  Once itching under control stop the med.  It may return.  Restart medication if it does. 07/25/21  Yes McDiarmid, Blane Ohara, MD     Family History Family History  Problem Relation Age of Onset   Hypertension Mother    Diabetes type II Maternal Uncle    Colon cancer Neg Hx    Esophageal cancer Neg Hx    Rectal cancer Neg Hx    Stomach cancer Neg Hx     Social History Social History   Tobacco Use   Smoking status: Never   Smokeless tobacco: Never  Vaping Use   Vaping Use: Never used  Substance Use Topics   Alcohol use: Yes    Alcohol/week: 1.0 standard drink of alcohol    Types: 1 Standard drinks or equivalent per week    Comment: socially   Drug use: No     Allergies   Penicillins   Review of Systems Review of Systems  Musculoskeletal:  Positive for back pain.  Per HPI  Physical Exam Triage Vital Signs ED Triage Vitals  Enc Vitals Group     BP 12/24/21 0825 124/83     Pulse Rate 12/24/21 0825 67     Resp 12/24/21 0825 18     Temp 12/24/21 0825 98.2 F (36.8 C)     Temp Source 12/24/21 0825 Oral     SpO2 12/24/21 0825 100 %     Weight --      Height --      Head Circumference --      Peak Flow --      Pain Score 12/24/21 0824 10     Pain Loc --      Pain Edu? --      Excl. in [place]? --    No data found.  Updated Vital Signs BP 124/83 (BP Location: Left Arm)   Pulse 67   Temp 98.2 F (36.8 C) (Oral)   Resp 18   SpO2 100%   Visual Acuity Right Eye Distance:   Left Eye Distance:   Bilateral Distance:    Right Eye Near:   Left Eye Near:    Bilateral Near:     Physical Exam Vitals and nursing note reviewed.  Constitutional:      Appearance: Normal appearance. He is not ill-appearing or toxic-appearing.     Comments: Very pleasant patient sitting on exam in position of comfort table in no acute distress.   HENT:     Head: Normocephalic and atraumatic.     Right Ear: Hearing and external ear normal.     Left Ear: Hearing and external ear normal.     Nose: Nose normal.     Mouth/Throat:     Lips: Pink.     Mouth: Mucous membranes are moist.  Eyes:     General:  Lids are normal. Vision grossly intact. Gaze aligned appropriately.     Extraocular Movements: Extraocular movements intact.     Conjunctiva/sclera: Conjunctivae normal.  Cardiovascular:     Heart sounds: S1 normal and S2 normal.  Pulmonary:     Effort: Pulmonary effort is normal.  Breath sounds: Normal air entry.  Abdominal:     Palpations: Abdomen is soft.  Musculoskeletal:     Cervical back: Neck supple.     Right lower leg: No edema.     Left lower leg: No edema.     Comments: TTP of the bilateral sciatic nerve joints as well as the bilateral paraspinals. No obvious sign of injury, erythema, edema, or deformity. ROM limited due to pain. Negative SLR bilaterally. Sensation intact bilaterally.   Skin:    General: Skin is warm and dry.     Capillary Refill: Capillary refill takes less than 2 seconds.     Findings: No rash.  Neurological:     General: No focal deficit present.     Mental Status: He is alert and oriented to person, place, and time. Mental status is at baseline.     Cranial Nerves: No dysarthria or facial asymmetry.     Gait: Gait is intact.  Psychiatric:        Mood and Affect: Mood normal.        Speech: Speech normal.        Behavior: Behavior normal.        Thought Content: Thought content normal.        Judgment: Judgment normal.      UC Treatments / Results  Labs (all labs ordered are listed, but only abnormal results are displayed) Labs Reviewed - No data to display  EKG   Radiology No results found.  Procedures Procedures (including critical care time)  Medications Ordered in UC Medications  methylPREDNISolone sodium succinate (SOLU-MEDROL) 125 mg/2 mL injection 125 mg (125 mg Intramuscular Given 12/24/21 0930)    Initial Impression / Assessment and Plan / UC Course  I have reviewed the triage vital signs and the nursing notes.  Pertinent labs & imaging results that were available during my care of the patient were reviewed by me and  considered in my medical decision making (see chart for details).   1. Bilateral low back pain with bilateral sciatica X-ray of the lumbar spine obtained due to ongoing symptoms is negative for acute bony abnormality.  Symptoms and physical exam consistent with ongoing low back pain with bilateral sciatica.  Patient given 125 mg Solu-Medrol injection in clinic for pain relief and to reduce inflammation.  He is to begin taking naproxen anti-inflammatory medication starting tomorrow twice daily for the next 2 to 3 days then as needed.  Patient has home supply of omeprazole 20 mg and has been advised to take this to protect his stomach lining since he has had recent steroid burst and is now going to be taking naproxen.  Advised to take with food to avoid stomach upset and increase water intake to stay well-hydrated.  Heat and gentle range of motion exercises advised.  Patient has a follow-up appointment with PCP on Thursday (2 days from now) scheduled and has been advised to attend this appointment for further evaluation and possible physical therapy referral for further evaluation.  He may continue use of tizanidine muscle relaxer as needed as well but has been advised once more to avoid taking this with alcohol or driving due to possible drowsiness. Patient agreeable with plan.   Discussed physical exam and available lab work findings in clinic with patient.  Counseled patient regarding appropriate use of medications and potential side effects for all medications recommended or prescribed today. Discussed red flag signs and symptoms of worsening condition,when to call the PCP office, return  to urgent care, and when to seek higher level of care in the emergency department. Patient verbalizes understanding and agreement with plan. All questions answered. Patient discharged in stable condition.  Final Clinical Impressions(s) / UC Diagnoses   Final diagnoses:  Bilateral low back pain with bilateral sciatica,  unspecified chronicity     Discharge Instructions      We gave you a steroid injection in the clinic today for your bilateral lower back pain with sciatic nerve involvement. Your x-ray was negative for acute bony abnormality to your back.  This is very good news and very reassuring! I would like for you to start taking naproxen anti-inflammatory medicine starting tomorrow twice daily for the next 2 to 3 days to reduce inflammation to your back.  Do not take any naproxen until tomorrow since we gave you the steroid shot in the clinic today. Take omeprazole 20 mg daily while taking naproxen to protect your stomach.  Use heat and perform gentle range of motion exercises and stretches.   Follow-up with PCP as scheduled on Thursday for further evaluation and ongoing management of this.   If you develop any new or worsening symptoms or do not improve in the next 2 to 3 days, please return.  If your symptoms are severe, please go to the emergency room.  Follow-up with your primary care provider for further evaluation and management of your symptoms as well as ongoing wellness visits.  I hope you feel better!     ED Prescriptions     Medication Sig Dispense Auth. Provider   naproxen (NAPROSYN) 500 MG tablet Take 1 tablet (500 mg total) by mouth 2 (two) times daily. 30 tablet Talbot Grumbling, FNP      PDMP not reviewed this encounter.   Talbot Grumbling, [place] 12/26/21 1929

## 2021-12-26 ENCOUNTER — Ambulatory Visit (INDEPENDENT_AMBULATORY_CARE_PROVIDER_SITE_OTHER): Payer: Commercial Managed Care - HMO | Admitting: Family Medicine

## 2021-12-26 ENCOUNTER — Encounter: Payer: Self-pay | Admitting: Family Medicine

## 2021-12-26 VITALS — BP 128/77 | HR 67 | Ht 67.0 in | Wt 234.0 lb

## 2021-12-26 DIAGNOSIS — M533 Sacrococcygeal disorders, not elsewhere classified: Secondary | ICD-10-CM | POA: Diagnosis not present

## 2021-12-26 NOTE — Progress Notes (Unsigned)
Alexander Gentry is {Pc accompanied by:5710} Sources of clinical information for visit is/are {Information source:60032}. Nursing assessment for this office visit was reviewed with the patient for accuracy and revision.     Previous Report(s) Reviewed: {Outside review:15817}     12/26/2021   11:01 AM  Depression screen PHQ 2/9  Decreased Interest 0  Down, Depressed, Hopeless 0  PHQ - 2 Score 0  Altered sleeping 0  Tired, decreased energy 0  Change in appetite 0  Feeling bad or failure about yourself  0  Trouble concentrating 0  Moving slowly or fidgety/restless 0  Suicidal thoughts 0  PHQ-9 Score 0   Flowsheet Row Office Visit from 12/26/2021 in [place] Office Visit from 07/25/2021 in [place] Office Visit from 03/13/2021 in [place]  Thoughts that you would be better off dead, or of hurting yourself in some way Not at all Not at all Not at all  PHQ-9 Total Score 0 0 0          03/13/2021    9:02 AM  Kathryn in the past year? 0  Number falls in past yr: 0  Injury with Fall? 0       12/26/2021   11:01 AM 07/25/2021    9:08 AM 03/13/2021    9:02 AM  PHQ9 SCORE ONLY  PHQ-9 Total Score 0 0 0    Adult vaccines due  Topic Date Due   TETANUS/TDAP  09/07/2030    There are no preventive care reminders to display for this patient.    History/P.E. limitations: {exam; limitations ed:60112}  Adult vaccines due  Topic Date Due   TETANUS/TDAP  09/07/2030   There are no preventive care reminders to display for this patient. There are no preventive care reminders to display for this patient.   Chief Complaint  Patient presents with   Back Pain   BACK PAIN Acute or gradual onset: *** Events around onset: {causes; back pain:5284}; {Causes; back pain:32249::"no known injury"} Location: {Anatomy; location back pain:31199} Quality: {Symptoms; back pain:31426}; {Desc; back pain  quality:31200}{ Date Onset: *** Worse with: {Causes; aggravating factors back pain:32251}; {aggravators:31424}{Symptoms; ed back pain denies:11062}  Better with: ***  Radiation: {Symptoms; back pain radiation:11559} Morning stiffness: {YES/NO/WILD CARDS:18581} Trauma/repetitive stress: {YES/NO/WILD CARDS:18581} Better sitting or leaning forward: {YES/NO/WILD CARDS:18581} Bladder symptoms: {YES/NO/WILD CARDS:18581}  PMH of back pain: {history; pain back:5285::"recurrent self limited episodes of low back pain in the past"} Imaging Hx: *** Back pain prior therapy Hx: {Back pain prior rx:10585}{Meds; back pain:10579}  Red Flags  Fecal/urinary incontinence: {YES/NO/WILD SEGBT:51761}  High energy trauma: {YES/NO/WILD CARDS:18581}  Numbness/Weakness: {YES/NO/WILD CARDS:18581}  Fever/chills/sweats: {YES/NO/WILD CARDS:18581}  Night pain: {YES/NO/WILD CARDS:18581}  Unexplained weight loss: {YES/NO/WILD CARDS:18581}  No relief with bedrest: {YES/NO/WILD CARDS:18581}  h/o cancer/immunosuppression: {YES/NO/WILD CARDS:18581}  IV drug use: {YES/NO/WILD CARDS:18581}  PMH of osteoporosis or chronic steroid use: {YES/NO/WILD CARDS:18581}    Ddx {Diagnoses; back pain:10215} {Diagnoses; back pain:5351} {back pain dx:315351} {Diagnoses; back pain:16452} {Diagnoses; back pain:5351}  Plan  {Meds; back pain:10579} {Plan; back pain:10213} {Plan; back pain:32248} {Treatments; back pain:31425} {Treatments; back pain:32172} {findings; stiffness back:10584}   --------------------------------------------------------------------------------------------------------------------------------------------- Visit Problem List with A/P  No problem-specific Assessment & Plan notes found for this encounter.

## 2021-12-26 NOTE — Patient Instructions (Addendum)
I believe your low back pain is coming from your sacroiliac joints (SI joints.) They can rotate out of correct position and cause pain.   Continue you Naprosyn with omeprazole.  Do the exercises #6 and #7 on the patient education sheet.  Do 10 repetitions on each side at least twice a day.  These exercises are intended to help rotate your SI joint back into the normal position.    A referral was sent to Physical Therapy.  Let me know if you have not heard from them by Monday.  Physical Therapists can provide manipulations and exercises to move the SI joints that have gotten stiff.   Please have your HR office send me your FMLA paperwork or drop it by my office yourself.  Your work limitations of bending no more than twice a hour and no lifting more than 10 pounds is in effect until I see you back in two weeks.

## 2021-12-27 ENCOUNTER — Encounter: Payer: Self-pay | Admitting: Family Medicine

## 2022-01-02 NOTE — Therapy (Signed)
OUTPATIENT PHYSICAL THERAPY THORACOLUMBAR EVALUATION   Patient Name: Alexander Gentry MRN: [id number] DOB:1970/04/26, 51 y.o., male Today's Date: 01/03/2022   PT End of Session - 01/03/22 1109     Visit Number 1    Number of Visits 7    Date for PT Re-Evaluation 02/15/22    Authorization Type [employer]    PT Start Time 1110   patient late   PT Stop Time 1142    PT Time Calculation (min) 32 min    Activity Tolerance Patient tolerated treatment well    Behavior During Therapy [facility] for tasks assessed/performed             Past Medical History:  Diagnosis Date   Acanthosis nigricans 07/26/2021   Chronic pain of left knee 04/23/2017   Chronic pain of right knee 11/17/2013   S/P ACL and meniscectomy on right knee in 2014.    Diverticulitis    Elevated transaminase level 12/07/2012   HYPERLIPIDEMIA 05/13/2007   Hyperplastic colon polyp 05/2019   Screening colonoscopy by Dr Carlean Purl (GI) - Follow up in 10 years recommended.    Hypertension 11/2010   OBESITY 05/13/2007   Perforation of sigmoid colon due to diverticulitis 04/24/2019   ROTATOR CUFF INJURY, RIGHT SHOULDER 05/24/2009   Past Surgical History:  Procedure Laterality Date   KNEE ARTHROSCOPY WITH ANTERIOR CRUCIATE LIGAMENT (ACL) REPAIR Right 2014   Dr Ivin Booty Manson Passey, [facility]) Cadaveric ACL transplant   MENISCUS REPAIR Right 2014   Dr Ivin Booty [facility]on Passey, [facility])   Patient Active Problem List   Diagnosis Date Noted   Flexural eczema 07/26/2021   Migraine 07/26/2021   At risk of diabetes mellitus 07/26/2021   Gastroesophageal reflux disease 09/07/2020   Hypertension goal BP (blood pressure) < 140/90 06/22/2008    Class: Chronic   Hyperlipidemia LDL goal <130 05/13/2007   OBESITY 05/13/2007    PCP: McDiarmid, Blane Ohara, MD  REFERRING PROVIDER: McDiarmid, Blane Ohara, MD  REFERRING DIAG: M53.3 (ICD-10-CM) - SI (sacroiliac) pain  Rationale for Evaluation and Treatment Rehabilitation  THERAPY DIAG:  Other low back  pain  Muscle weakness (generalized)  ONSET DATE: 12/09/21  SUBJECTIVE:                                                                                                                                                                                           SUBJECTIVE STATEMENT: Patient reports immediate onset of low back and sharp pain going down the posterior left leg when he turned to the left to look for something behind him on 12/09/21. He then tried to bend down and tie his shoes and this made it worse.  He reports the initial pain was in his low back and radiated along bilateral posterior thighs. He reports the pain is improving. Right now his pain is along the left side of the low back to the buttock. He hasn't noticed pain going down the lower extremities in "awhile." He denies any numbness or tingling. He reports a history of this similar pain about 10-15 years that lasted for about a week or two caused by a similar mechanism. He denies any changes in bowel or bladder. He has current work restrictions of no lifting >10 lbs and no twisting/bending for 2 weeks.  PERTINENT HISTORY:  Rt ACLR and meniscal repair 2014   PAIN:  Are you having pain? Yes: NPRS scale: 4/10 Pain location: left low back and buttock Pain description: dull; sharp Aggravating factors: bending; twisting Relieving factors: heat   PRECAUTIONS: None  WEIGHT BEARING RESTRICTIONS No  FALLS:  Has patient fallen in last 6 months? 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 Vertebral body heights are preserved, without evidence of acute injury. Alignment  is normal. The disc heights are preserved. No significant degenerative changes identified. There is no evidence of spondylolysis   The SI joints and symphysis pubis are intact. The soft tissues are unremarkable.   IMPRESSION: Unremarkable lumbar spine radiographs.  PATIENT SURVEYS:  FOTO 58%  SCREENING FOR RED FLAGS: Bowel or bladder incontinence: No Spinal tumors: No Cauda equina syndrome: No Compression fracture: No Abdominal aneurysm: No  COGNITION:  Overall cognitive status: Within functional limits for tasks assessed     SENSATION: Not tested  MUSCLE LENGTH: Hamstrings: WNL  POSTURE: increased lumbar lordosis  PALPATION: Tautness and palpable tenderness bilateral lumbar paraspinals Lumbar PAIVM WNL  LUMBAR ROM:   Active  A/PROM  eval  Flexion 25% limited pulling left low back   Extension WNL  Right lateral flexion WNL pulling left low back  Left lateral flexion WNL  Right rotation WNL  Left rotation WNL   (Blank rows = not tested)  LOWER EXTREMITY ROM:     Active  Right eval Left eval  Hip flexion    Hip extension    Hip abduction    Hip adduction    Hip internal rotation    Hip external rotation    Knee flexion    Knee extension    Ankle dorsiflexion    Ankle plantarflexion    Ankle inversion    Ankle eversion     (Blank rows = not tested)  LOWER EXTREMITY MMT:    MMT Right eval Left eval  Hip flexion 4+ 4  Hip extension 4- 4-  Hip abduction 4 4  Hip adduction    Hip internal rotation    Hip external rotation    Knee flexion    Knee extension    Ankle dorsiflexion    Ankle plantarflexion    Ankle inversion    Ankle eversion     (Blank rows = not tested)  LUMBAR SPECIAL TESTS:  (+) SLR (-) SI Compression/Distraction (-) Sacral Thrust (-) Gaenslen's  (+) Ely's   FUNCTIONAL TESTS:  Squat: increased trunk flexion; pulling sensation   GAIT: Distance walked: 10 ft  Assistive device utilized: None Level of assistance:  Complete Independence Comments: WNL    TODAY'S TREATMENT  [facility] Adult PT Treatment:                                                DATE: 01/02/22 Therapeutic Exercise: Demonstrated and issue initial HEP.    Therapeutic Activity: Education on assessment findings that will be addressed throughout duration of POC.    PATIENT EDUCATION:  Education details: see treatment  Person educated: Patient Education method: Explanation, Demonstration, Tactile cues, Verbal cues, and Handouts Education comprehension: verbalized understanding, returned demonstration, verbal cues required, tactile cues required, and needs further education   HOME EXERCISE PROGRAM: Access Code: [email or web address] URL: [email or web address] Date: 01/03/2022 Prepared by: Gwendolyn Grant  Exercises - Standing Lumbar Extension  - 6 x daily - 7 x weekly - 1 sets - 10 reps - Supine 90/90 Sciatic Nerve Glide with Knee Flexion/Extension  - 1 x daily - 7 x weekly - 1 sets - 10 reps - Supine Pelvic Tilt  - 1 x daily - 7 x weekly - 2 sets - 10 reps - 5 sec  hold  ASSESSMENT:  CLINICAL IMPRESSION: Patient is a 51 y.o. male who was seen today for physical therapy evaluation and treatment for acute low back/lower extremity pain following a quick rotational movement of the trunk on 12/09/21. He is noted to have good lumbar ROM with exception of slight limitation into flexion and pain reported with flexion and right lateral flexion AROM. In addition he is noted to have core and hip weakness and aberrant body mechanics with lifting. He will benefit from skilled PT to address the above stated deficits in order to optimize his function and assist in overall pain reduction.    OBJECTIVE IMPAIRMENTS decreased activity tolerance, decreased ROM, decreased strength, impaired flexibility, improper body mechanics, and pain.   ACTIVITY LIMITATIONS carrying, lifting, bending, squatting, and locomotion level  PARTICIPATION LIMITATIONS:  shopping, community activity, and occupation  PERSONAL FACTORS Age, Fitness, and Profession are also affecting patient's functional outcome.   REHAB POTENTIAL: Good  CLINICAL DECISION MAKING: Stable/uncomplicated  EVALUATION COMPLEXITY: Low   GOALS: Goals reviewed with patient? Yes  SHORT TERM GOALS: = LTG    LONG TERM GOALS: Target date: 02/14/22  Patient will be able to lift at least 25 lbs with proper form without pain.  Baseline: see above  Goal status: INITIAL  2.  Patient will score at least 75% on FOTO to signify clinically meaningful improvement in functional abilities.   Baseline: 58 Goal status: INITIAL  3.  Patient will report pain as </= 2/10 to reduce his current functional limitations.  Baseline: see above  Goal status: INITIAL  4.  Patient will demonstrate at least 4+/5 bilateral hip strength to improve overall lumbopelvic stability.  Baseline: see above  Goal status: INITIAL  5.  Patient will be independent with advanced HEP to progress/maintain current level of function.  Baseline: initial HEP issued  Goal status: INITIAL    PLAN: PT FREQUENCY: 1x/week  PT DURATION: 6 weeks  PLANNED INTERVENTIONS: Therapeutic exercises, Therapeutic activity, Neuromuscular re-education, Balance training, Patient/Family education, Self Care, Dry Needling, Electrical stimulation, Spinal manipulation, Spinal mobilization, Cryotherapy, Moist heat, Traction, Manual therapy, and Re-evaluation.  PLAN FOR NEXT SESSION: review and progress HEP; response to extension movement; hip and core strengthening.   Gwendolyn Grant,  PT, DPT, ATC 01/03/22 12:35 PM

## 2022-01-03 ENCOUNTER — Ambulatory Visit: Payer: Commercial Managed Care - HMO | Attending: Family Medicine

## 2022-01-03 DIAGNOSIS — M5459 Other low back pain: Secondary | ICD-10-CM | POA: Diagnosis present

## 2022-01-03 DIAGNOSIS — M6281 Muscle weakness (generalized): Secondary | ICD-10-CM | POA: Diagnosis present

## 2022-01-03 DIAGNOSIS — M533 Sacrococcygeal disorders, not elsewhere classified: Secondary | ICD-10-CM | POA: Diagnosis not present

## 2022-01-06 ENCOUNTER — Telehealth: Payer: Self-pay | Admitting: Family Medicine

## 2022-01-06 NOTE — Telephone Encounter (Signed)
Patient dropped off FMLA form to be completed by PCP.  Last seen by PCP was on 12/16/21.  Put form in blue folder.

## 2022-01-07 NOTE — Telephone Encounter (Signed)
Placed some forms in your box that the pt will need filled out. Thank you!

## 2022-01-09 ENCOUNTER — Ambulatory Visit (INDEPENDENT_AMBULATORY_CARE_PROVIDER_SITE_OTHER): Payer: Commercial Managed Care - HMO | Admitting: Family Medicine

## 2022-01-09 ENCOUNTER — Encounter: Payer: Self-pay | Admitting: Family Medicine

## 2022-01-09 VITALS — BP 122/67 | HR 70 | Ht 67.0 in | Wt 234.4 lb

## 2022-01-09 DIAGNOSIS — Z23 Encounter for immunization: Secondary | ICD-10-CM

## 2022-01-09 DIAGNOSIS — M545 Low back pain, unspecified: Secondary | ICD-10-CM | POA: Diagnosis not present

## 2022-01-09 DIAGNOSIS — E785 Hyperlipidemia, unspecified: Secondary | ICD-10-CM

## 2022-01-09 NOTE — Patient Instructions (Addendum)
Continue your physical therapy, working on your core strength.  Working on weight loss will also help with your back pain.    Use the Naproxyn as needed for pain.  When you take Naprosyn, please take a dose of omeprazole to protect your stomach.   We are checking your cholesterol today.

## 2022-01-10 ENCOUNTER — Telehealth: Payer: Self-pay | Admitting: Family Medicine

## 2022-01-10 ENCOUNTER — Encounter: Payer: Self-pay | Admitting: Family Medicine

## 2022-01-10 ENCOUNTER — Ambulatory Visit: Payer: Commercial Managed Care - HMO

## 2022-01-10 DIAGNOSIS — M545 Low back pain, unspecified: Secondary | ICD-10-CM

## 2022-01-10 DIAGNOSIS — M6281 Muscle weakness (generalized): Secondary | ICD-10-CM

## 2022-01-10 DIAGNOSIS — M5459 Other low back pain: Secondary | ICD-10-CM | POA: Diagnosis not present

## 2022-01-10 DIAGNOSIS — E785 Hyperlipidemia, unspecified: Secondary | ICD-10-CM

## 2022-01-10 HISTORY — DX: Low back pain, unspecified: M54.50

## 2022-01-10 LAB — LDL CHOLESTEROL, DIRECT: LDL Direct: 133 mg/dL — ABNORMAL HIGH (ref 0–99)

## 2022-01-10 MED ORDER — EZETIMIBE 10 MG PO TABS: 10.0000 mg | ORAL_TABLET | Freq: Every day | ORAL | 3 refills | Status: DC

## 2022-01-10 MED ORDER — ATORVASTATIN CALCIUM 80 MG PO TABS: 80.0000 mg | ORAL_TABLET | Freq: Every day | ORAL | 3 refills | Status: DC

## 2022-01-10 NOTE — Therapy (Signed)
OUTPATIENT PHYSICAL THERAPY TREATMENT NOTE   Patient Name: Alexander Gentry MRN: [id number] DOB:1970/07/18, 51 y.o., male Today's Date: 01/10/2022  PCP: McDiarmid, Blane Ohara, MD REFERRING PROVIDER: McDiarmid, Blane Ohara, MD  END OF SESSION:   PT End of Session - 01/10/22 1107     Visit Number 2    Number of Visits 7    Date for PT Re-Evaluation 02/15/22    Authorization Type [employer]    PT Start Time 1112   patient late then fire alarm drill activated once he was arrived.   PT Stop Time 1144    PT Time Calculation (min) 32 min    Activity Tolerance Patient tolerated treatment well    Behavior During Therapy WFL for tasks assessed/performed             Past Medical History:  Diagnosis Date   Acanthosis nigricans 07/26/2021   Chronic pain of left knee 04/23/2017   Chronic pain of right knee 11/17/2013   S/P ACL and meniscectomy on right knee in 2014.    Diverticulitis    Elevated transaminase level 12/07/2012   HYPERLIPIDEMIA 05/13/2007   Hyperplastic colon polyp 05/2019   Screening colonoscopy by Dr Carlean Purl (GI) - Follow up in 10 years recommended.    Hypertension 11/2010   OBESITY 05/13/2007   Perforation of sigmoid colon due to diverticulitis 04/24/2019   ROTATOR CUFF INJURY, RIGHT SHOULDER 05/24/2009   Past Surgical History:  Procedure Laterality Date   KNEE ARTHROSCOPY WITH ANTERIOR CRUCIATE LIGAMENT (ACL) REPAIR Right 2014   Dr Ivin Booty Manson Passey, [facility]) Cadaveric ACL transplant   MENISCUS REPAIR Right 2014   Dr Ivin Booty Manson Passey, [facility])   Patient Active Problem List   Diagnosis Date Noted   Acute bilateral low back pain without sciatica 01/10/2022   Flexural eczema 07/26/2021   Migraine 07/26/2021   At risk of diabetes mellitus 07/26/2021   Gastroesophageal reflux disease 09/07/2020   Hypertension goal BP (blood pressure) < 140/90 06/22/2008    Class: Chronic   Hyperlipidemia LDL goal <130 05/13/2007   OBESITY 05/13/2007    REFERRING DIAG: M53.3 (ICD-10-CM)  - SI (sacroiliac) pain  THERAPY DIAG:  Other low back pain  Muscle weakness (generalized)  Rationale for Evaluation and Treatment Rehabilitation  PERTINENT HISTORY: Rt ACLR and meniscal repair 2014   PRECAUTIONS: none   SUBJECTIVE: Patient reports he is feeling better. He reports compliance with HEP. He has not noticed any LE pain this week.   PAIN:  Are you having pain? Yes: NPRS scale: 4/10 Pain location: Lt low back  Pain description: sharp Aggravating factors: getting up and bending Relieving factors: sitting; heat    OBJECTIVE: (objective measures completed at initial evaluation unless otherwise dated)  DIAGNOSTIC FINDINGS:  Lumbar spine X-ray FINDINGS: There are 5 non-rib-bearing lumbar-type vertebral bodies. Vertebral body heights are preserved, without evidence of acute injury. Alignment is normal. The disc heights are preserved. No significant degenerative changes identified. There is no evidence of spondylolysis   The SI joints and symphysis pubis are intact. The soft tissues are unremarkable.   IMPRESSION: Unremarkable lumbar spine radiographs.   PATIENT SURVEYS:  FOTO 58%   SCREENING FOR RED FLAGS: Bowel or bladder incontinence: No Spinal tumors: No Cauda equina syndrome: No Compression fracture: No Abdominal aneurysm: No   COGNITION:           Overall cognitive status: Within functional limits for tasks assessed  SENSATION: Not tested   MUSCLE LENGTH: Hamstrings: WNL   POSTURE: increased lumbar lordosis   PALPATION: Tautness and palpable tenderness bilateral lumbar paraspinals Lumbar PAIVM WNL   LUMBAR ROM:    Active  A/PROM  eval  Flexion 25% limited pulling left low back   Extension WNL  Right lateral flexion WNL pulling left low back  Left lateral flexion WNL  Right rotation WNL  Left rotation WNL   (Blank rows = not tested)   LOWER EXTREMITY ROM:      Active  Right eval Left eval  Hip flexion       Hip extension      Hip abduction      Hip adduction      Hip internal rotation      Hip external rotation      Knee flexion      Knee extension      Ankle dorsiflexion      Ankle plantarflexion      Ankle inversion      Ankle eversion       (Blank rows = not tested)   LOWER EXTREMITY MMT:     MMT Right eval Left eval  Hip flexion 4+ 4  Hip extension 4- 4-  Hip abduction 4 4  Hip adduction      Hip internal rotation      Hip external rotation      Knee flexion      Knee extension      Ankle dorsiflexion      Ankle plantarflexion      Ankle inversion      Ankle eversion       (Blank rows = not tested)   LUMBAR SPECIAL TESTS:  (+) SLR (-) SI Compression/Distraction (-) Sacral Thrust (-) Gaenslen's  (+) Ely's    FUNCTIONAL TESTS:  Squat: increased trunk flexion; pulling sensation    GAIT: Distance walked: 10 ft  Assistive device utilized: None Level of assistance: Complete Independence Comments: WNL       TODAY'S TREATMENT  [facility] Adult PT Treatment:                                                DATE: 01/10/22 Therapeutic Exercise: Standing lumbar extension x 10  Prone press up x 10  LTR x 1 minute Hip bridge 2 x 10  Supine posterior pelvic tilts 1 x 10 Sidelying hip abduction 2 x 10  Prone hip extension 2 x 10  Updated HEP  Manual Therapy: DTM Lt lumbar paraspinals;QL Lumbar CPAs grade II-III LAD x 30 sec each; no effect     [facility] Adult PT Treatment:                                                DATE: 01/02/22 Therapeutic Exercise: Demonstrated and issue initial HEP.      Therapeutic Activity: Education on assessment findings that will be addressed throughout duration of POC.      PATIENT EDUCATION:  Education details: see treatment  Person educated: Patient Education method: Explanation, Demonstration, Tactile cues, Verbal cues, and Handouts Education comprehension: verbalized understanding, returned demonstration, verbal cues required,  tactile cues required, and needs further education  HOME EXERCISE PROGRAM: Access Code: [email or web address] URL: [email or web address] Date: 01/03/2022 Prepared by: Gwendolyn Grant   Exercises - Standing Lumbar Extension  - 6 x daily - 7 x weekly - 1 sets - 10 reps - Supine 90/90 Sciatic Nerve Glide with Knee Flexion/Extension  - 1 x daily - 7 x weekly - 1 sets - 10 reps - Supine Pelvic Tilt  - 1 x daily - 7 x weekly - 2 sets - 10 reps - 5 sec  hold   ASSESSMENT:   CLINICAL IMPRESSION: Session was somewhat limited as he was late for scheduled visit and then fire alarm drill was conducted as soon as patient checked in for visit. He is noted to have tautness and palpable tenderness about Lt lumbar paraspinals and QL. He will potentially benefit from TPDN at future sessions if tautness remains.Reviewed HEP with patient requiring cues for proper completion of pelvic tilts. He responded well to prone pressup with abolishment of back pain, but his pain quickly returns when he goes to stand. Instructed on frequent completion of repeated extension as part of HEP to determine if this has a lasting effect on his pain with patient verbalizing understanding. He tolerated core and hip strengthening well without an increase in pain.    OBJECTIVE IMPAIRMENTS decreased activity tolerance, decreased ROM, decreased strength, impaired flexibility, improper body mechanics, and pain.    ACTIVITY LIMITATIONS carrying, lifting, bending, squatting, and locomotion level   PARTICIPATION LIMITATIONS: shopping, community activity, and occupation   PERSONAL FACTORS Age, Fitness, and Profession are also affecting patient's functional outcome.    REHAB POTENTIAL: Good   CLINICAL DECISION MAKING: Stable/uncomplicated   EVALUATION COMPLEXITY: Low     GOALS: Goals reviewed with patient? Yes   SHORT TERM GOALS: = LTG      LONG TERM GOALS: Target date: 02/14/22   Patient will be able to lift at least 25 lbs  with proper form without pain.  Baseline: see above  Goal status: INITIAL   2.  Patient will score at least 75% on FOTO to signify clinically meaningful improvement in functional abilities.    Baseline: 58 Goal status: INITIAL   3.  Patient will report pain as </= 2/10 to reduce his current functional limitations.  Baseline: see above  Goal status: INITIAL   4.  Patient will demonstrate at least 4+/5 bilateral hip strength to improve overall lumbopelvic stability.  Baseline: see above  Goal status: INITIAL   5.  Patient will be independent with advanced HEP to progress/maintain current level of function.  Baseline: initial HEP issued  Goal status: INITIAL       PLAN: PT FREQUENCY: 1x/week   PT DURATION: 6 weeks   PLANNED INTERVENTIONS: Therapeutic exercises, Therapeutic activity, Neuromuscular re-education, Balance training, Patient/Family education, Self Care, Dry Needling, Electrical stimulation, Spinal manipulation, Spinal mobilization, Cryotherapy, Moist heat, Traction, Manual therapy, and Re-evaluation.   PLAN FOR NEXT SESSION: review and progress HEP; response to extension movement; hip and core strengthening.    Gwendolyn Grant, PT, DPT, ATC 01/10/22 11:45 AM

## 2022-01-10 NOTE — Progress Notes (Signed)
Alexander Gentry is alone Sources of clinical information for visit is/are patient. Nursing assessment for this office visit was reviewed with the patient for accuracy and revision.     Previous Report(s) Reviewed: Physical Therapy Assessment and Plans    01/09/2022   10:41 AM  Depression screen PHQ 2/9  Decreased Interest 0  Down, Depressed, Hopeless 0  PHQ - 2 Score 0  Altered sleeping 0  Tired, decreased energy 0  Change in appetite 0  Feeling bad or failure about yourself  0  Trouble concentrating 0  Moving slowly or fidgety/restless 0  Suicidal thoughts 0  PHQ-9 Score 0   Flowsheet Row Office Visit from 01/09/2022 in [place] Office Visit from 12/26/2021 in [place] Office Visit from 07/25/2021 in [place]  Thoughts that you would be better off dead, or of hurting yourself in some way Not at all Not at all Not at all  PHQ-9 Total Score 0 0 0          03/13/2021    9:02 AM  [place] in the past year? 0  Number falls in past yr: 0  Injury with Fall? 0       01/09/2022   10:41 AM 12/26/2021   11:01 AM 07/25/2021    9:08 AM  PHQ9 SCORE ONLY  PHQ-9 Total Score 0 0 0    Adult vaccines due  Topic Date Due   TETANUS/TDAP  09/07/2030    Health Maintenance Due  Topic Date Due   COVID-19 Vaccine (5 - Moderna risk series) 05/08/2021      History/P.E. limitations: none  Adult vaccines due  Topic Date Due   TETANUS/TDAP  09/07/2030   There are no preventive care reminders to display for this patient.  Health Maintenance Due  Topic Date Due   COVID-19 Vaccine (5 - Moderna risk series) 05/08/2021     Chief Complaint  Patient presents with   Follow-up    LOV   Back Pain     --------------------------------------------------------------------------------------------------------------------------------------------- Visit Problem List with A/P  No problem-specific Assessment &  Plan notes found for this encounter.

## 2022-01-10 NOTE — Telephone Encounter (Signed)
I spoke with Mr Sloop about his LDL cholesterol still high.   I would like to see a 50% reduction in his LDL  He is willing to take atorvastatin 80 mg daily with ezetimibe 10 mg daily.  He will return to [facility] lab in 3 months for LDL check.

## 2022-01-10 NOTE — Assessment & Plan Note (Addendum)
Established problem Uncontrolled Lab Results  Component Value Date   LDLCALC 127 (H) 07/25/2021   Patient on atorvastatin 40 mg daily which patient is taking.   Recommend increase of atorvastatin to 80 mg daily Start Ezetimibe 10 mg daily.  Return [facility] lab in 3 months for LDL monitoring.

## 2022-01-10 NOTE — Assessment & Plan Note (Addendum)
office visit notes 12/26/21 BACK PAIN Acute or gradual onset: Acute 12/10/21 when bending over to tie shoes. Bilateral back pain with radiation into thighs.    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 Better sitting or leaning forward: no Bladder symptoms: no   PMH of back pain: no prior back problems Patient seen again for this acute pain at UC on 12/24/21 where treated Naprosyn 500 mg BID Imaging Hx: Complete LS spine 12/24/21 which was unremarkable Back pain prior therapy Hx: none, medication: NSAID: steorids, NSAID: currently on Naprosyn w/ omeprazole 20 mg   Red Flags   Fecal/urinary incontinence: no  High energy trauma: no  Numbness/Weakness: no  Fever/chills/sweats: no  Night pain: no  Unexplained weight loss: no  No relief with bedrest: no  h/o cancer/immunosuppression: no  IV drug use: no  PMH of osteoporosis or chronic steroid use: no    Social history: 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 PT referral. Follow-up in 2 weeks. Light duty with reevaluation in two weeks and Advised to return for progressive numbness,  weakness or incontinence FMLA paperwork completion when available  Today (01/10/22) Persistent low back pain has lessened, now more localized ache in the left lower back and buttock, with intermittent sharp pains in same region. 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   Naprosyn with omeprazole decreases the pain some, but not entirely.  No Red Flag symptoms have developed  He has attended his initial Physical Therapy session (01/03/22) of planned eight. FOTO 58% Examination found functional limitation of lumbar flexion, o/w good ROM Assessment of lumbar flexion impairment, core and hip strength impairment. Functional limitations in carrying, lifting, bending, squatting,   Assessment and Plan  Low back pain without sciatica, improving - Continue Physical Therapy with work restrictions on lifting, carrying, bending and squatting. Restrictions are recommended to employer while patient is engaged with Physical Therapy.  Will revisit restrictions at end of Physical Therapy, if necessary.  - Continue PRN use Naprosyn with omeprazole as needed for pain

## 2022-01-16 NOTE — Therapy (Addendum)
OUTPATIENT PHYSICAL THERAPY TREATMENT NOTE PHYSICAL THERAPY DISCHARGE SUMMARY  Visits from Start of Care: 3  Current functional level related to goals / functional outcomes: No re-assessment of goals.    Remaining deficits: Status unknown   Education / Equipment: N/a   Patient agrees to discharge. Patient goals were not met. Patient is being discharged due to not returning since the last visit.   Patient Name: Alexander Gentry MRN: [id number] DOB:05/07/1970, 51 y.o., male Today's Date: 01/17/2022  PCP: McDiarmid, Blane Ohara, MD REFERRING PROVIDER: McDiarmid, Blane Ohara, MD  END OF SESSION:   PT End of Session - 01/17/22 1148     Visit Number 3    Number of Visits 7    Date for PT Re-Evaluation 02/15/22    Authorization Type [employer]    PT Start Time 1148    PT Stop Time 1230    PT Time Calculation (min) 42 min    Activity Tolerance Patient tolerated treatment well    Behavior During Therapy WFL for tasks assessed/performed              Past Medical History:  Diagnosis Date   Acanthosis nigricans 07/26/2021   Chronic pain of left knee 04/23/2017   Chronic pain of right knee 11/17/2013   S/P ACL and meniscectomy on right knee in 2014.    Diverticulitis    Elevated transaminase level 12/07/2012   HYPERLIPIDEMIA 05/13/2007   Hyperplastic colon polyp 05/2019   Screening colonoscopy by Dr Carlean Purl (GI) - Follow up in 10 years recommended.    Hypertension 11/2010   OBESITY 05/13/2007   Perforation of sigmoid colon due to diverticulitis 04/24/2019   ROTATOR CUFF INJURY, RIGHT SHOULDER 05/24/2009   Past Surgical History:  Procedure Laterality Date   KNEE ARTHROSCOPY WITH ANTERIOR CRUCIATE LIGAMENT (ACL) REPAIR Right 2014   Dr Ivin Booty Manson [place], [facility]) Cadaveric ACL transplant   MENISCUS REPAIR Right 2014   Dr Ivin Booty Manson [place], [facility])   Patient Active Problem List   Diagnosis Date Noted   Acute bilateral low back pain without sciatica 01/10/2022   Flexural eczema  07/26/2021   Migraine 07/26/2021   At risk of diabetes mellitus 07/26/2021   Gastroesophageal reflux disease 09/07/2020   Hypertension goal BP (blood pressure) < 140/90 06/22/2008    Class: Chronic   Hyperlipidemia LDL goal <130 05/13/2007   OBESITY 05/13/2007    REFERRING DIAG: M53.3 (ICD-10-CM) - SI (sacroiliac) pain  THERAPY DIAG:  Other low back pain  Muscle weakness (generalized)  Rationale for Evaluation and Treatment Rehabilitation  PERTINENT HISTORY: Rt ACLR and meniscal repair 2014   PRECAUTIONS: none   SUBJECTIVE: Patient reports he is feeling better, feeling mostly tightness every now and then in the back. He reports consistency with HEP.   PAIN:  Are you having pain? No   OBJECTIVE: (objective measures completed at initial evaluation unless otherwise dated)  DIAGNOSTIC FINDINGS:  Lumbar spine X-ray FINDINGS: There are 5 non-rib-bearing lumbar-type vertebral bodies. Vertebral body heights are preserved, without evidence of acute injury. Alignment is normal. The disc heights are preserved. No significant degenerative changes identified. There is no evidence of spondylolysis   The SI joints and symphysis pubis are intact. The soft tissues are unremarkable.   IMPRESSION: Unremarkable lumbar spine radiographs.   PATIENT SURVEYS:  FOTO 58%   SCREENING FOR RED FLAGS: Bowel or bladder incontinence: No Spinal tumors: No Cauda equina syndrome: No Compression fracture: No Abdominal aneurysm: No   COGNITION:  Overall cognitive status: Within functional limits for tasks assessed                          SENSATION: Not tested   MUSCLE LENGTH: Hamstrings: WNL   POSTURE: increased lumbar lordosis   PALPATION: [place] and palpable tenderness bilateral lumbar paraspinals Lumbar PAIVM WNL   LUMBAR ROM:    Active  A/PROM  eval  Flexion 25% limited pulling left low back   Extension WNL  Right lateral flexion WNL pulling left low back  Left  lateral flexion WNL  Right rotation WNL  Left rotation WNL   (Blank rows = not tested)   LOWER EXTREMITY ROM:      Active  Right eval Left eval  Hip flexion      Hip extension      Hip abduction      Hip adduction      Hip internal rotation      Hip external rotation      Knee flexion      Knee extension      Ankle dorsiflexion      Ankle plantarflexion      Ankle inversion      Ankle eversion       (Blank rows = not tested)   LOWER EXTREMITY MMT:     MMT Right eval Left eval 01/17/22  Hip flexion 4+ 4   Hip extension 4- 4-   Hip abduction 4 4 4+/5 bilateral   Hip adduction       Hip internal rotation       Hip external rotation       Knee flexion       Knee extension       Ankle dorsiflexion       Ankle plantarflexion       Ankle inversion       Ankle eversion        (Blank rows = not tested)   LUMBAR SPECIAL TESTS:  (+) SLR (-) SI Compression/Distraction (-) Sacral Thrust (-) Gaenslen's  (+) Ely's    FUNCTIONAL TESTS:  Squat: increased trunk flexion; pulling sensation    GAIT: Distance walked: 10 ft  Assistive device utilized: None Level of assistance: Complete Independence Comments: WNL       TODAY'S TREATMENT  [facility] Adult PT Treatment:                                                DATE: 01/17/22 Therapeutic Exercise: LTR figure 4 x  1 minute each  Supine posterior pelvic tilts x 10; 5 sec hold  Hip bridge 2 x 10  Supine TA march 2 x 10  Squat to table 2 x 10  Standing hip hinge with dowel behind back and in front 1 x 10 each  Deadlift with 5lbs kettle bell 1 x 10  Leg press 3 x 10 @ 60 lbs  Sidelying hip abduction 2 x 10; 2nd set 2#  Updated HEP     [facility] Adult PT Treatment:                                                DATE: 01/10/22 Therapeutic Exercise: Standing  lumbar extension x 10  Prone press up x 10  LTR x 1 minute Hip bridge 2 x 10  Supine posterior pelvic tilts 1 x 10 Sidelying hip abduction 2 x 10  Prone hip extension 2  x 10  Updated HEP  Manual Therapy: DTM Lt lumbar paraspinals;QL Lumbar CPAs grade II-III LAD x 30 sec each; no effect     [facility] Adult PT Treatment:                                                DATE: 01/02/22 Therapeutic Exercise: Demonstrated and issue initial HEP.      Therapeutic Activity: Education on assessment findings that will be addressed throughout duration of POC.      PATIENT EDUCATION:  Education details: see treatment  Person educated: Patient Education method: Explanation, Demonstration, Tactile cues, Verbal cues, and Handouts Education comprehension: verbalized understanding, returned demonstration, verbal cues required, tactile cues required, and needs further education     HOME EXERCISE PROGRAM: Access Code: [email or web address] URL: [email or web address] Date: 01/03/2022 Prepared by: Gwendolyn Grant   Exercises - Standing Lumbar Extension  - 6 x daily - 7 x weekly - 1 sets - 10 reps - Supine 90/90 Sciatic Nerve Glide with Knee Flexion/Extension  - 1 x daily - 7 x weekly - 1 sets - 10 reps - Supine Pelvic Tilt  - 1 x daily - 7 x weekly - 2 sets - 10 reps - 5 sec  hold   ASSESSMENT:   CLINICAL IMPRESSION: Patient arrives without reports of back pain. Able to progress core/hip strengthening and begin light lifting activity. He has initial difficulty performing hip hinge as well as allowing proper gluteal activation to return to standing from hip hinge. With continued practice and use of dowel he is able to properly perform ascent and descent of hip hinge. No reports of back pain with bending/lifting activity. Good tolerance to hip and core strengthening with patient quickly fatiguing with targeted gluteal strengthening.    OBJECTIVE IMPAIRMENTS decreased activity tolerance, decreased ROM, decreased strength, impaired flexibility, improper body mechanics, and pain.    ACTIVITY LIMITATIONS carrying, lifting, bending, squatting, and locomotion level    PARTICIPATION LIMITATIONS: shopping, community activity, and occupation   PERSONAL FACTORS Age, Fitness, and Profession are also affecting patient's functional outcome.    REHAB POTENTIAL: Good   CLINICAL DECISION MAKING: Stable/uncomplicated   EVALUATION COMPLEXITY: Low     GOALS: Goals reviewed with patient? Yes   SHORT TERM GOALS: = LTG      LONG TERM GOALS: Target date: 02/14/22   Patient will be able to lift at least 25 lbs with proper form without pain.  Baseline: see above  Goal status: INITIAL   2.  Patient will score at least 75% on FOTO to signify clinically meaningful improvement in functional abilities.    Baseline: 58 Goal status: INITIAL   3.  Patient will report pain as </= 2/10 to reduce his current functional limitations.  Baseline: see above  Goal status: INITIAL   4.  Patient will demonstrate at least 4+/5 bilateral hip strength to improve overall lumbopelvic stability.  Baseline: see above  Goal status: INITIAL   5.  Patient will be independent with advanced HEP to progress/maintain current level of function.  Baseline: initial HEP issued  Goal status: INITIAL       PLAN:  PT FREQUENCY: 1x/week   PT DURATION: 6 weeks   PLANNED INTERVENTIONS: Therapeutic exercises, Therapeutic activity, Neuromuscular re-education, Balance training, Patient/Family education, Self Care, Dry Needling, Electrical stimulation, Spinal manipulation, Spinal mobilization, Cryotherapy, Moist heat, Traction, Manual therapy, and Re-evaluation.   PLAN FOR NEXT SESSION: review and progress HEP; hip/core strengthening; progress lifting as tolerated  Gwendolyn Grant, PT, DPT, ATC 01/17/22 12:30 PM   Gwendolyn Grant, PT, DPT, ATC 03/13/22 12:49 PM

## 2022-01-17 ENCOUNTER — Ambulatory Visit: Payer: Commercial Managed Care - HMO | Attending: Family Medicine

## 2022-01-17 DIAGNOSIS — M5459 Other low back pain: Secondary | ICD-10-CM | POA: Insufficient documentation

## 2022-01-17 DIAGNOSIS — M6281 Muscle weakness (generalized): Secondary | ICD-10-CM | POA: Diagnosis present

## 2022-01-24 ENCOUNTER — Ambulatory Visit: Payer: Commercial Managed Care - HMO | Admitting: Physical Therapy

## 2022-02-07 ENCOUNTER — Ambulatory Visit: Payer: Commercial Managed Care - HMO | Admitting: Physical Therapy

## 2022-02-14 ENCOUNTER — Ambulatory Visit: Payer: Commercial Managed Care - HMO

## 2022-03-21 ENCOUNTER — Other Ambulatory Visit: Payer: Self-pay | Admitting: Family Medicine

## 2022-03-21 DIAGNOSIS — G44009 Cluster headache syndrome, unspecified, not intractable: Secondary | ICD-10-CM

## 2022-03-21 DIAGNOSIS — I10 Essential (primary) hypertension: Secondary | ICD-10-CM

## 2022-09-25 ENCOUNTER — Other Ambulatory Visit: Payer: Self-pay | Admitting: Family Medicine

## 2022-09-25 DIAGNOSIS — I10 Essential (primary) hypertension: Secondary | ICD-10-CM

## 2022-09-25 DIAGNOSIS — G43809 Other migraine, not intractable, without status migrainosus: Secondary | ICD-10-CM

## 2022-09-26 NOTE — Telephone Encounter (Signed)
[facility] before further refills  

## 2022-11-06 ENCOUNTER — Ambulatory Visit (INDEPENDENT_AMBULATORY_CARE_PROVIDER_SITE_OTHER): Payer: Commercial Managed Care - HMO | Admitting: Family Medicine

## 2022-11-06 ENCOUNTER — Encounter: Payer: Self-pay | Admitting: Family Medicine

## 2022-11-06 ENCOUNTER — Ambulatory Visit (HOSPITAL_COMMUNITY)
Admission: RE | Admit: 2022-11-06 | Discharge: 2022-11-06 | Disposition: A | Discharge status: Home / Self Care | Payer: Self-pay | Source: Ambulatory Visit | Attending: Family Medicine | Admitting: Family Medicine

## 2022-11-06 VITALS — BP 135/88 | HR 70 | Wt 235.6 lb

## 2022-11-06 DIAGNOSIS — Z79899 Other long term (current) drug therapy: Secondary | ICD-10-CM

## 2022-11-06 DIAGNOSIS — E785 Hyperlipidemia, unspecified: Secondary | ICD-10-CM

## 2022-11-06 DIAGNOSIS — R002 Palpitations: Secondary | ICD-10-CM | POA: Insufficient documentation

## 2022-11-06 DIAGNOSIS — R9431 Abnormal electrocardiogram [ECG] [EKG]: Secondary | ICD-10-CM | POA: Insufficient documentation

## 2022-11-06 DIAGNOSIS — Z9189 Other specified personal risk factors, not elsewhere classified: Secondary | ICD-10-CM

## 2022-11-06 DIAGNOSIS — I10 Essential (primary) hypertension: Secondary | ICD-10-CM

## 2022-11-06 HISTORY — DX: Palpitations: R00.2

## 2022-11-06 NOTE — Assessment & Plan Note (Signed)
Established problem Asympotmatic. Checking A1c

## 2022-11-06 NOTE — Assessment & Plan Note (Signed)
Established problem Taking his atorvastatin and ezetimibe without intolerance Checking Lipids Continue current prescriptions

## 2022-11-06 NOTE — Assessment & Plan Note (Signed)
New complaint Since of flutter in chest about twice a day No associated chest pain or dizziness.  Last about a minute, ongoing for sveral weeks.   Cor: regular, several premature beats, rate 72, no M.  Ext: no edema Lung: BCTA, no acc mm use  EKG NSR, no evidence chamber enlargment, isolated TWI III.    A/ Suspect premature beats are source of sensation P/ Monitor for now. Checking TSh

## 2022-11-06 NOTE — Assessment & Plan Note (Signed)
Established problem. Adequate blood pressure control.  No evidence of new end organ damage.  Tolerating medication without significant adverse effects.  Plan to continue current blood pressure medication regiment.  Recommend home BP monitoring.

## 2022-11-06 NOTE — Progress Notes (Signed)
Doristine Locks is alone Sources of clinical information for visit is/are patient. Nursing assessment for this office visit was reviewed with the patient for accuracy and revision.     Previous Report(s) Reviewed: none     11/06/2022    8:43 AM  Depression screen PHQ 2/9  Decreased Interest 0  Down, Depressed, Hopeless 0  PHQ - 2 Score 0   Flowsheet Row Office Visit from 01/09/2022 in [facility] Office Visit from 12/26/2021 in [facility] Office Visit from 07/25/2021 in [place] [facility]  Thoughts that you would be better off dead, or of hurting yourself in some way Not at all Not at all Not at all  PHQ-9 Total Score 0 0 0          11/06/2022    8:43 AM 03/13/2021    9:02 AM  Fall Risk   Falls in the past year? 0 0  Number falls in past yr: 0 0  Injury with Fall? 0 0  Risk for fall due to : No Fall Risks        11/06/2022    8:43 AM 01/09/2022   10:41 AM 12/26/2021   11:01 AM  PHQ9 SCORE ONLY  PHQ-9 Total Score 0 0 0    There are no preventive care reminders to display for this patient.  Health Maintenance Due  Topic Date Due   COVID-19 Vaccine (5 - 2023-24 season) 12/13/2021      History/P.E. limitations: none  There are no preventive care reminders to display for this patient. There are no preventive care reminders to display for this patient.  Health Maintenance Due  Topic Date Due   COVID-19 Vaccine (5 - 2023-24 season) 12/13/2021     Chief Complaint  Patient presents with   Hypertension     --------------------------------------------------------------------------------------------------------------------------------------------- Visit Problem List with A/P  Hyperlipidemia LDL goal <130 Established problem Taking his atorvastatin and ezetimibe without intolerance Checking Lipids Continue current prescriptions  Hypertension goal BP (blood pressure) < 140/90 Established problem. Adequate  blood pressure control.  No evidence of new end organ damage.  Tolerating medication without significant adverse effects.  Plan to continue current blood pressure medication regiment.  Recommend home BP monitoring.    At risk of diabetes mellitus Established problem Asympotmatic. Checking A1c  Palpitations New complaint Since of flutter in chest about twice a day No associated chest pain or dizziness.  Last about a minute, ongoing for sveral weeks.   Cor: regular, several premature beats, rate 72, no M.  Ext: no edema Lung: BCTA, no acc mm use  EKG NSR, no evidence chamber enlargment, isolated TWI III.    A/ Suspect premature beats are source of sensation P/ Monitor for now. Checking TSh

## 2022-11-06 NOTE — Patient Instructions (Addendum)
Your EKG looks good.  I suspect you are feeling heart beats that happen earlier than usual.  These are called PACs and PVCs by doctors.  If they become more disturbing to you or if they are come with chest pain or dizziness, let Dr Kedron Uno know.  We would need to do more of a work up at that point.    I believe you may have some tendon stretch and arthritis developing in the side of your right foot.  It seems likely that your flat feet are leading to your feet rolling inward too much.  Getting fitted for shoes that will have a good instep support may help with the pain.    Get a home blood pressure cuff, the kind where the cuff wraps around your upper arm.  Keep a record of your blood pressure.  Your home blood pressure should be below 135 over 85 most of the time.    We are checking your cholesterol, kidneys, sugar, and electrolytes.   Dr Shanitha Twining will let you know it they are abnormal.  If they are normal, he will send your a message through your MyChart.

## 2022-11-10 ENCOUNTER — Telehealth: Payer: Self-pay | Admitting: Family Medicine

## 2022-11-10 NOTE — Telephone Encounter (Signed)
I spoke with Alexander Gentry about his blood tests from last week.  We discussed the A1c 5.7% as putting him at risk of diabetes.  We discussed role of diet and exercise in preventing progression to diabetes. Recommendation 150 moderate exercise per week along with 5-7% reduction in body weight.

## 2023-01-21 ENCOUNTER — Encounter: Payer: Self-pay | Admitting: Family Medicine

## 2023-01-26 ENCOUNTER — Telehealth: Payer: Self-pay | Admitting: Family Medicine

## 2023-01-26 NOTE — Telephone Encounter (Signed)
Patient is dropping off FMLA paper work to be completed. LDOS 11-06-22.   I have placed paper work in [employer].

## 2023-01-28 NOTE — Telephone Encounter (Signed)
Clinical info completed on FMLA form.  Placed form in PCP's box for completion.    When form is completed, please route note to "RN Team" and place in wall pocket in front office.   Aquilla Solian, CMA

## 2023-02-04 ENCOUNTER — Encounter: Payer: Self-pay | Admitting: Family Medicine

## 2023-02-04 NOTE — Telephone Encounter (Signed)
MyChart message sent to Alexander Gentry asking for clarfication of reasons he is requesting completion of the FMLA form.

## 2023-02-05 NOTE — Telephone Encounter (Signed)
FMLA form completed and placed in Front office Nurse box.

## 2023-02-09 NOTE — Telephone Encounter (Signed)
FMLA form completed and placed in front office Nurses 's Box

## 2023-07-09 ENCOUNTER — Encounter: Payer: Self-pay | Admitting: Family Medicine

## 2023-07-09 ENCOUNTER — Ambulatory Visit: Payer: Self-pay | Admitting: Family Medicine

## 2023-07-09 VITALS — BP 170/90 | HR 76 | Ht 67.0 in | Wt 229.1 lb

## 2023-07-09 DIAGNOSIS — E785 Hyperlipidemia, unspecified: Secondary | ICD-10-CM

## 2023-07-09 DIAGNOSIS — Z79899 Other long term (current) drug therapy: Secondary | ICD-10-CM

## 2023-07-09 DIAGNOSIS — K219 Gastro-esophageal reflux disease without esophagitis: Secondary | ICD-10-CM

## 2023-07-09 DIAGNOSIS — I10 Essential (primary) hypertension: Secondary | ICD-10-CM

## 2023-07-09 DIAGNOSIS — Z9189 Other specified personal risk factors, not elsewhere classified: Secondary | ICD-10-CM

## 2023-07-09 DIAGNOSIS — R7303 Prediabetes: Secondary | ICD-10-CM

## 2023-07-09 LAB — POCT GLYCOSYLATED HEMOGLOBIN (HGB A1C): Hemoglobin A1C: 5.4 % (ref 4.0–5.6)

## 2023-07-09 NOTE — Patient Instructions (Signed)
 The plan for prepared healthy meals sounds like a very good one.  Working on taking your medications more consistently also sounds like a good idea.    We are checking your Kidneys, electrolytes, and cholesterol.   Dr Aristeo Hankerson will let you know it they are abnormal.   If they are normal, he will send your a message through your MyChart   You may get the Covid vaccination at your pharmacy.  I usually recommend get it on the day before you have time off from work in case it makes you feel tired and sluggish.

## 2023-07-09 NOTE — Assessment & Plan Note (Addendum)
 Established problem Uncontrolled.  Patient is not at goal of SBP < 140 Alexander Gentry reports inconsistency in taking his olmesartan 20 mg and hydrochlorothiazide 25 mg daily No evidence of end organ damage.  Plan Lifestyle changes, including healthy prepared meals and biking Recommend consistency in taking olmesartan and hydrochlorothiazide.  Monitoring Basic Metabolic Panel drawn RTC 4 weeks

## 2023-07-09 NOTE — Progress Notes (Signed)
 Alexander Gentry is alone Sources of clinical information for visit is/are patient. Nursing assessment for this office visit was reviewed with the patient for accuracy and revision.     Previous Report(s) Reviewed: none     07/09/2023   10:06 AM  Depression screen PHQ 2/9  Down, Depressed, Hopeless 0  PHQ - 2 Score 0  Altered sleeping 0  Tired, decreased energy 0  Change in appetite 0  Feeling bad or failure about yourself  0  Trouble concentrating 0  Moving slowly or fidgety/restless 0  Suicidal thoughts 0  PHQ-9 Score 0   Flowsheet Row Office Visit from 07/09/2023 in [facility] - A Dept Of [place]. [facility] Office Visit from 01/09/2022 in [facility] - A Dept Of [facility]. [facility] Office Visit from 12/26/2021 in [facility] - A Dept Of [place]. [facility]  Thoughts that you would be better off dead, or of hurting yourself in some way Not at all Not at all Not at all  PHQ-9 Total Score 0 0 0          11/06/2022    8:43 AM 03/13/2021    9:02 AM  Fall Risk   Falls in the past year? 0 0  Number falls in past yr: 0 0  Injury with Fall? 0 0  Risk for fall due to : No Fall Risks        07/09/2023   10:06 AM 11/06/2022    8:43 AM 01/09/2022   10:41 AM  PHQ9 SCORE ONLY  PHQ-9 Total Score 0 0 0    There are no preventive care reminders to display for this patient.  Health Maintenance Due  Topic Date Due   INFLUENZA VACCINE  11/13/2022      History/P.E. limitations: none  There are no preventive care reminders to display for this patient. There are no preventive care reminders to display for this patient.  Health Maintenance Due  Topic Date Due   INFLUENZA VACCINE  11/13/2022     Chief Complaint  Patient presents with   Hypertension     --------------------------------------------------------------------------------------------------------------------------------------------- Visit  Problem List with A/P  Hypertension goal BP (blood pressure) < 140/90 Established problem Uncontrolled.  Patient is not at goal of SBP < 140 Alexander Gentry reports inconsistency in taking his olmesartan 20 mg and hydrochlorothiazide 25 mg daily No evidence of end organ damage.  Plan Lifestyle changes, including healthy prepared meals and biking Recommend consistency in taking olmesartan and hydrochlorothiazide.  Monitoring Basic Metabolic Panel drawn RTC 4 weeks   At risk of diabetes mellitus Lab Results  Component Value Date   HGBA1C 5.4 07/09/2023   Established problem that has improved with 6 pounds loss and improvement in A1c from 5.7% last year.  Lifestyle changes (see hypertension)    Gastroesophageal reflux disease Established problem Alexander Gentry taking omeprazole OTC as needed.   Hyperlipidemia LDL goal <130 Established problem Alexander Gentry reports some inconsitency in taking atorvastatin and ezetimibe.  Plan Check Lipid panel Consistency in taking atorvastatin and ezetimibe Healthy life style changes

## 2023-07-09 NOTE — Assessment & Plan Note (Signed)
 Lab Results  Component Value Date   HGBA1C 5.4 07/09/2023   Established problem that has improved with 6 pounds loss and improvement in A1c from 5.7% last year.  Lifestyle changes (see hypertension)

## 2023-07-09 NOTE — Assessment & Plan Note (Signed)
 Established problem Alexander Gentry reports some inconsitency in taking atorvastatin and ezetimibe.  Plan Check Lipid panel Consistency in taking atorvastatin and ezetimibe Healthy life style changes

## 2023-07-09 NOTE — Assessment & Plan Note (Signed)
 Established problem Alexander Gentry taking omeprazole OTC as needed.

## 2023-07-10 ENCOUNTER — Encounter: Payer: Self-pay | Admitting: Family Medicine

## 2023-07-10 ENCOUNTER — Other Ambulatory Visit: Payer: Self-pay | Admitting: Family Medicine

## 2023-07-10 DIAGNOSIS — I10 Essential (primary) hypertension: Secondary | ICD-10-CM

## 2023-07-10 DIAGNOSIS — G44009 Cluster headache syndrome, unspecified, not intractable: Secondary | ICD-10-CM

## 2023-07-10 DIAGNOSIS — E785 Hyperlipidemia, unspecified: Secondary | ICD-10-CM

## 2023-07-10 LAB — BASIC METABOLIC PANEL WITH GFR
BUN/Creatinine Ratio: 14 (ref 9–20)
BUN: 12 mg/dL (ref 6–24)
CO2: 20 mmol/L (ref 20–29)
Calcium: 9.6 mg/dL (ref 8.7–10.2)
Chloride: 103 mmol/L (ref 96–106)
Creatinine, Ser: 0.87 mg/dL (ref 0.76–1.27)
Glucose: 77 mg/dL (ref 70–99)
Potassium: 4.6 mmol/L (ref 3.5–5.2)
Sodium: 142 mmol/L (ref 134–144)
eGFR: 103 mL/min/{1.73_m2} (ref >59)

## 2023-07-10 LAB — LDL CHOLESTEROL, DIRECT: LDL Direct: 189 mg/dL — ABNORMAL HIGH (ref 0–99)

## 2023-07-10 MED ORDER — ATORVASTATIN CALCIUM 80 MG PO TABS: 80.0000 mg | ORAL_TABLET | Freq: Every day | ORAL | 3 refills | Status: AC

## 2023-07-10 MED ORDER — EZETIMIBE 10 MG PO TABS: 10.0000 mg | ORAL_TABLET | Freq: Every day | ORAL | 3 refills | Status: AC

## 2023-07-10 MED ORDER — HYDROCHLOROTHIAZIDE 25 MG PO TABS: 25.0000 mg | ORAL_TABLET | Freq: Every day | ORAL | 3 refills | Status: AC

## 2023-07-10 MED ORDER — OLMESARTAN MEDOXOMIL 20 MG PO TABS: 20.0000 mg | ORAL_TABLET | Freq: Every day | ORAL | 3 refills | Status: AC

## 2023-10-02 ENCOUNTER — Ambulatory Visit (INDEPENDENT_AMBULATORY_CARE_PROVIDER_SITE_OTHER): Payer: Self-pay | Admitting: Family Medicine

## 2023-10-02 VITALS — BP 143/92 | HR 64 | Ht 67.0 in | Wt 225.4 lb

## 2023-10-02 DIAGNOSIS — M25562 Pain in left knee: Secondary | ICD-10-CM

## 2023-10-02 MED ORDER — NAPROXEN 500 MG PO TABS: 500.0000 mg | ORAL_TABLET | Freq: Two times a day (BID) | ORAL | 0 refills | Status: AC

## 2023-10-02 NOTE — Progress Notes (Unsigned)
    SUBJECTIVE:   CHIEF COMPLAINT / HPI:   LL is a 53yo M w/ HTN, HLD that pf L knee pain.  L knee pain - past 3 weeks, starting to have L knee pain - Pain on medial side, feels tightness with flexion - No preceding falls or new activity. He does mountain bike, wonders if this could have contributed. - Has been applying ice and ibuprofen.  - Styanding for long periods of time makes it worse.   OBJECTIVE:   BP (!) 161/91   Pulse 64   Ht 5' 7 (1.702 m)   Wt 225 lb 6.4 oz (102.2 kg)   SpO2 100%   BMI 35.30 kg/m   General: Alert, pleasant man. NAD. HEENT: NCAT. MMM. CV: RRR, no murmurs.  Resp: CTAB, no wheezing or crackles. Normal WOB on RA.  Abm: Soft, nontender, nondistended. BS present. Skin: Warm, well perfused  MSK: TTP on L knee medial superior to patellar area. TTP diffusely in distal hamstring/posterior L knee. No clicking felt with movement of knee. No valgus or varus laxity. Negative anterior and posterior drawer.   ASSESSMENT/PLAN:   Assessment & Plan Acute pain of left knee Suspect overuse injury such as Quadricep tendinopathy or muscle sprain. Given acute nature, no imaging indicated at this time but could consider if persisting >6wks. - Start naroxen 500mg  BID for 10 days, then prn.  - Cont tylenol  prn - Counseled on knee conditioning and strengthening exercises - f/u in 4 wks if not improving. Consider Knee XR and PT at that time.    Twyla Nearing, MD [facility]

## 2023-10-02 NOTE — Patient Instructions (Signed)
 Good to see you today - Thank you for coming in  Things we discussed today:  1) You most likely over-strained the muscles supporting your knee including your quads and hamstrings.  - Start taking Naprosyn  500mg  twice a day for the next 10 days. Then you can switch to just taking it as needed. Do NOT take this at the same time as ibuprofen. - After 10 days you can switch to just taking it as needed. - You can also take tylenol  at the same time as needed for additional pain.  2) do regular knee conditioning exercises for 20 to 30 minutes 5 days a week for the next month.  This will help restrengthen the support of your knees, improve your pain, and help prevent future injuries like this.  3) you can wear a compressive knee brace during the day to help decrease swelling and support your knee slightly.   Come back to see me in 4 weeks if your pain is not improving.

## 2024-04-12 ENCOUNTER — Ambulatory Visit (INDEPENDENT_AMBULATORY_CARE_PROVIDER_SITE_OTHER): Payer: Self-pay

## 2024-04-12 DIAGNOSIS — Z23 Encounter for immunization: Secondary | ICD-10-CM | POA: Diagnosis not present

## 2024-04-12 NOTE — Progress Notes (Signed)
 Patient presents to nurse clinic for flu vaccination. Administered in RD without complication.   Patient asked about pneumonia vaccination. Spoke with preceptor, Dr. Rumball, who advised that patient is up to date on this vaccine.   Chiquita JAYSON English, RN
